# Patient Record
Sex: Female | Born: 1965 | Race: Black or African American | Hispanic: No | Marital: Married | Smoking: Former smoker
Health system: Southern US, Community
[De-identification: ages and names within clinical notes are randomized; demographics above are authoritative.]

## PROBLEM LIST (undated history)

## (undated) DIAGNOSIS — I10 Essential (primary) hypertension: Secondary | ICD-10-CM

## (undated) HISTORY — PX: NO PAST SURGERIES: SHX2092

---

## 2000-01-04 ENCOUNTER — Emergency Department (HOSPITAL_COMMUNITY): Admission: EM | Admit: 2000-01-04 | Discharge: 2000-01-04 | Payer: Self-pay | Admitting: *Deleted

## 2006-10-17 ENCOUNTER — Emergency Department (HOSPITAL_COMMUNITY): Admission: EM | Admit: 2006-10-17 | Discharge: 2006-10-17 | Payer: Self-pay | Admitting: *Deleted

## 2007-05-30 ENCOUNTER — Emergency Department (HOSPITAL_COMMUNITY): Admission: EM | Admit: 2007-05-30 | Discharge: 2007-05-30 | Payer: Self-pay | Admitting: Emergency Medicine

## 2007-11-19 ENCOUNTER — Emergency Department (HOSPITAL_COMMUNITY): Admission: EM | Admit: 2007-11-19 | Discharge: 2007-11-19 | Payer: Self-pay | Admitting: Emergency Medicine

## 2008-09-14 ENCOUNTER — Emergency Department (HOSPITAL_COMMUNITY): Admission: EM | Admit: 2008-09-14 | Discharge: 2008-09-14 | Payer: Self-pay | Admitting: Emergency Medicine

## 2010-03-02 ENCOUNTER — Emergency Department (HOSPITAL_COMMUNITY)
Admission: EM | Admit: 2010-03-02 | Discharge: 2010-03-02 | Payer: Self-pay | Source: Home / Self Care | Admitting: Emergency Medicine

## 2010-06-01 LAB — POCT PREGNANCY, URINE: Preg Test, Ur: NEGATIVE

## 2010-06-01 LAB — POCT I-STAT, CHEM 8
BUN: 10 mg/dL (ref 6–23)
Calcium, Ion: 1.16 mmol/L (ref 1.12–1.32)
Chloride: 108 mEq/L (ref 96–112)
Glucose, Bld: 106 mg/dL — ABNORMAL HIGH (ref 70–99)
Potassium: 4.1 mEq/L (ref 3.5–5.1)

## 2010-06-01 LAB — URINALYSIS, ROUTINE W REFLEX MICROSCOPIC
Glucose, UA: NEGATIVE mg/dL
Hgb urine dipstick: NEGATIVE
Ketones, ur: NEGATIVE mg/dL
Protein, ur: NEGATIVE mg/dL
pH: 6 (ref 5.0–8.0)

## 2010-06-28 LAB — URINE CULTURE: Culture: NO GROWTH

## 2010-06-28 LAB — DIFFERENTIAL
Eosinophils Relative: 0 % (ref 0–5)
Lymphs Abs: 2.4 10*3/uL (ref 0.7–4.0)
Monocytes Absolute: 0.7 10*3/uL (ref 0.1–1.0)
Monocytes Relative: 9 % (ref 3–12)
Neutro Abs: 4.6 10*3/uL (ref 1.7–7.7)

## 2010-06-28 LAB — URINE MICROSCOPIC-ADD ON

## 2010-06-28 LAB — COMPREHENSIVE METABOLIC PANEL
ALT: 16 U/L (ref 0–35)
Alkaline Phosphatase: 65 U/L (ref 39–117)
BUN: 9 mg/dL (ref 6–23)
Chloride: 103 mEq/L (ref 96–112)
Glucose, Bld: 91 mg/dL (ref 70–99)
Potassium: 3.6 mEq/L (ref 3.5–5.1)
Sodium: 138 mEq/L (ref 135–145)
Total Bilirubin: 1.2 mg/dL (ref 0.3–1.2)
Total Protein: 8.7 g/dL — ABNORMAL HIGH (ref 6.0–8.3)

## 2010-06-28 LAB — CBC
HCT: 41.2 % (ref 36.0–46.0)
Hemoglobin: 14.4 g/dL (ref 12.0–15.0)
MCV: 96.2 fL (ref 78.0–100.0)
Platelets: 275 10*3/uL (ref 150–400)
WBC: 7.8 10*3/uL (ref 4.0–10.5)

## 2010-06-28 LAB — POCT CARDIAC MARKERS: Troponin i, poc: <0.05 ng/mL (ref 0.00–0.09)

## 2010-06-28 LAB — URINALYSIS, ROUTINE W REFLEX MICROSCOPIC
Glucose, UA: NEGATIVE mg/dL
Hgb urine dipstick: NEGATIVE
Ketones, ur: >80 mg/dL — AB
Specific Gravity, Urine: 1.04 — ABNORMAL HIGH (ref 1.005–1.030)
pH: 5.5 (ref 5.0–8.0)

## 2010-06-28 LAB — LIPASE, BLOOD: Lipase: 23 U/L (ref 11–59)

## 2010-06-28 LAB — POCT PREGNANCY, URINE: Preg Test, Ur: NEGATIVE

## 2011-06-22 ENCOUNTER — Encounter (HOSPITAL_COMMUNITY): Payer: Self-pay | Admitting: Emergency Medicine

## 2011-06-22 ENCOUNTER — Emergency Department (INDEPENDENT_AMBULATORY_CARE_PROVIDER_SITE_OTHER)
Admission: EM | Admit: 2011-06-22 | Discharge: 2011-06-22 | Disposition: A | Discharge status: Home / Self Care | Payer: Self-pay | Source: Home / Self Care | Attending: Family Medicine | Admitting: Family Medicine

## 2011-06-22 DIAGNOSIS — L0201 Cutaneous abscess of face: Secondary | ICD-10-CM

## 2011-06-22 HISTORY — DX: Essential (primary) hypertension: I10

## 2011-06-22 MED ORDER — AMOXICILLIN 500 MG PO CAPS: 500.0000 mg | ORAL_CAPSULE | Freq: Three times a day (TID) | ORAL | Status: AC

## 2011-06-22 MED ORDER — HYDROCODONE-ACETAMINOPHEN 5-325 MG PO TABS: ORAL_TABLET | ORAL | Status: AC

## 2011-06-22 NOTE — ED Provider Notes (Signed)
History     CSN: [id number]  Arrival date & time 06/22/11  0801   First MD Initiated Contact with Patient 06/22/11 [phone]      Chief Complaint  Patient presents with  . Facial Pain    (Consider location/radiation/quality/duration/timing/severity/associated sxs/prior treatment) HPI Comments: Tina Hensley presents for evaluation of swelling over her right cheek and face. She denies any injury to her face. She denies any dental pain. She does report a broken premolar. He states that this is been there for some time. She denies any fluctuance, drainage, or foul taste. She does report that her eye was stuck shut. This morning. But she denies any visual disturbance. She denies any dysphagia or odynophagia. She reports, that she is taking to penicillin tablets at her daughter had.  Patient is a 46 y.o. female presenting with tooth pain. The history is provided by the patient.  Dental PainPrimary symptoms do not include dental injury, oral bleeding or sore throat. The symptoms began 2 days ago. The symptoms are worsening. The symptoms are new.  Additional symptoms include: gum swelling, gum tenderness and facial swelling. Additional symptoms do not include: dental sensitivity to temperature, purulent gums, trouble swallowing, pain with swallowing and taste disturbance.    Past Medical History  Diagnosis Date  . Hypertension     History reviewed. No pertinent past surgical history.  No family history on file.  History  Substance Use Topics  . Smoking status: Current Everyday Smoker -- 0.5 packs/day    Types: Cigarettes  . Smokeless tobacco: Not on file  . Alcohol Use: 0.6 oz/week    1 Cans of beer per week    OB History    Grav Para Term Preterm Abortions TAB SAB Ect Mult Living                  Review of Systems  Constitutional: Negative.   HENT: Positive for facial swelling and dental problem. Negative for sore throat and trouble swallowing.   Eyes: Negative.   Respiratory: Negative.    Cardiovascular: Negative.   Gastrointestinal: Negative.   Genitourinary: Negative.   Musculoskeletal: Negative.   Skin: Negative.   Neurological: Negative.     Allergies  Review of patient's allergies indicates no known allergies.  Home Medications   Current Outpatient Rx  Name Route Sig Dispense Refill  . AMOXICILLIN 500 MG PO CAPS Oral Take 1 capsule (500 mg total) by mouth 3 (three) times daily. 30 capsule 0  . HYDROCODONE-ACETAMINOPHEN 5-325 MG PO TABS  Take one to two tablets every 4 to 6 hours as needed for pain 20 tablet 0    BP 172/115  Pulse 80  Temp(Src) 98.6 F (37 C) (Oral)  Resp 17  SpO2 100%  LMP 06/15/2011  Physical Exam  Nursing note and vitals reviewed. Constitutional: She is oriented to person, place, and time. She appears well-developed and well-nourished.  HENT:  Head: Normocephalic.    Mouth/Throat: Uvula is midline, oropharynx is clear and moist and mucous membranes are normal. Abnormal dentition. Dental abscesses present.    Eyes: EOM are normal.  Neck: Normal range of motion.  Pulmonary/Chest: Effort normal.  Musculoskeletal: Normal range of motion.  Neurological: She is alert and oriented to person, place, and time.  Skin: Skin is warm and dry.  Psychiatric: Her behavior is normal.    ED Course  Procedures (including critical care time)  Labs Reviewed - No data to display No results found.   1. Facial abscess  MDM  rx given for amoxicillin and hydrocodone; return in 48 hours for evaluation        Renaee Munda, MD 06/22/11 [phone]

## 2011-06-22 NOTE — ED Notes (Signed)
Pt. Stated, My face on the rt. Side is swelling and painful with a broke tooth , I'm not really sure.

## 2011-06-22 NOTE — Discharge Instructions (Signed)
Take antibiotics as directed. Use pain medication as needed. Return here in 48 hours for re-evaluation. Return to care sooner should your symptoms worsen in any way such as swelling of the eye, inability to eat or drink, or any other symptoms that concern you.

## 2011-06-22 NOTE — ED Notes (Signed)
Pt has HTN, but has not taken Medication in years

## 2015-04-13 ENCOUNTER — Encounter (HOSPITAL_COMMUNITY): Payer: Self-pay | Admitting: *Deleted

## 2015-04-13 ENCOUNTER — Emergency Department (HOSPITAL_COMMUNITY)
Admission: EM | Admit: 2015-04-13 | Discharge: 2015-04-14 | Disposition: A | Discharge status: Home / Self Care | Payer: Self-pay | Attending: Emergency Medicine | Admitting: Emergency Medicine

## 2015-04-13 DIAGNOSIS — I10 Essential (primary) hypertension: Secondary | ICD-10-CM | POA: Insufficient documentation

## 2015-04-13 DIAGNOSIS — R42 Dizziness and giddiness: Secondary | ICD-10-CM | POA: Insufficient documentation

## 2015-04-13 DIAGNOSIS — K529 Noninfective gastroenteritis and colitis, unspecified: Secondary | ICD-10-CM | POA: Insufficient documentation

## 2015-04-13 DIAGNOSIS — F419 Anxiety disorder, unspecified: Secondary | ICD-10-CM | POA: Insufficient documentation

## 2015-04-13 DIAGNOSIS — J3489 Other specified disorders of nose and nasal sinuses: Secondary | ICD-10-CM | POA: Insufficient documentation

## 2015-04-13 DIAGNOSIS — R51 Headache: Secondary | ICD-10-CM | POA: Insufficient documentation

## 2015-04-13 DIAGNOSIS — R000 Tachycardia, unspecified: Secondary | ICD-10-CM | POA: Insufficient documentation

## 2015-04-13 DIAGNOSIS — H6122 Impacted cerumen, left ear: Secondary | ICD-10-CM | POA: Insufficient documentation

## 2015-04-13 DIAGNOSIS — F17210 Nicotine dependence, cigarettes, uncomplicated: Secondary | ICD-10-CM | POA: Insufficient documentation

## 2015-04-13 DIAGNOSIS — R05 Cough: Secondary | ICD-10-CM | POA: Insufficient documentation

## 2015-04-13 LAB — COMPREHENSIVE METABOLIC PANEL
ALT: 16 U/L (ref 14–54)
AST: 19 U/L (ref 15–41)
Albumin: 3.6 g/dL (ref 3.5–5.0)
Alkaline Phosphatase: 61 U/L (ref 38–126)
Anion gap: 16 — ABNORMAL HIGH (ref 5–15)
BUN: 6 mg/dL (ref 6–20)
CO2: 21 mmol/L — ABNORMAL LOW (ref 22–32)
Calcium: 8.5 mg/dL — ABNORMAL LOW (ref 8.9–10.3)
Chloride: 110 mmol/L (ref 101–111)
Creatinine, Ser: 0.71 mg/dL (ref 0.44–1.00)
GFR calc Af Amer: >60 mL/min (ref >60)
GFR calc non Af Amer: >60 mL/min (ref >60)
Glucose, Bld: 89 mg/dL (ref 65–99)
Potassium: 4 mmol/L (ref 3.5–5.1)
Sodium: 147 mmol/L — ABNORMAL HIGH (ref 135–145)
Total Bilirubin: 0.3 mg/dL (ref 0.3–1.2)
Total Protein: 7.4 g/dL (ref 6.5–8.1)

## 2015-04-13 LAB — CBC WITH DIFFERENTIAL/PLATELET
Basophils Absolute: 0.1 10*3/uL (ref 0.0–0.1)
Basophils Relative: 1 %
Eosinophils Absolute: 0 10*3/uL (ref 0.0–0.7)
Eosinophils Relative: 1 %
HCT: 36.2 % (ref 36.0–46.0)
Hemoglobin: 12.2 g/dL (ref 12.0–15.0)
Lymphocytes Relative: 28 %
Lymphs Abs: 2.5 10*3/uL (ref 0.7–4.0)
MCH: 31.1 pg (ref 26.0–34.0)
MCHC: 33.7 g/dL (ref 30.0–36.0)
MCV: 92.3 fL (ref 78.0–100.0)
Monocytes Absolute: 0.6 10*3/uL (ref 0.1–1.0)
Monocytes Relative: 7 %
Neutro Abs: 5.7 10*3/uL (ref 1.7–7.7)
Neutrophils Relative %: 63 %
Platelets: 516 10*3/uL — ABNORMAL HIGH (ref 150–400)
RBC: 3.92 MIL/uL (ref 3.87–5.11)
RDW: 13.8 % (ref 11.5–15.5)
WBC: 8.9 10*3/uL (ref 4.0–10.5)

## 2015-04-13 LAB — LIPASE, BLOOD: Lipase: 23 U/L (ref 11–51)

## 2015-04-13 MED ORDER — SODIUM CHLORIDE 0.9 % IV BOLUS (SEPSIS)
1000.0000 mL | Freq: Once | INTRAVENOUS | Status: AC
  Administered 2015-04-13: 1000 mL via INTRAVENOUS

## 2015-04-13 MED ORDER — PROMETHAZINE HCL 25 MG/ML IJ SOLN
25.0000 mg | Freq: Once | INTRAMUSCULAR | Status: AC
  Administered 2015-04-13: 25 mg via INTRAVENOUS
  Filled 2015-04-13: qty 1

## 2015-04-13 MED ORDER — ONDANSETRON HCL 4 MG/2ML IJ SOLN
4.0000 mg | Freq: Once | INTRAMUSCULAR | Status: AC
  Administered 2015-04-13: 4 mg via INTRAVENOUS
  Filled 2015-04-13: qty 2

## 2015-04-13 MED ORDER — KETOROLAC TROMETHAMINE 30 MG/ML IJ SOLN
30.0000 mg | Freq: Once | INTRAMUSCULAR | Status: AC
  Administered 2015-04-13: 30 mg via INTRAVENOUS
  Filled 2015-04-13: qty 1

## 2015-04-13 NOTE — ED Notes (Signed)
Pt with hx of intermittent severe headaches for one month.  Pt reports that she has vomiting with the headaches.  Pt was taking care of family member that had GI bug and now has HA with n/v and diarrhea.

## 2015-04-13 NOTE — ED Notes (Signed)
Pt was given  zofran IV en route from [facility].  Pt has IV in L hand

## 2015-04-13 NOTE — ED Provider Notes (Signed)
CSN: [id number]     Arrival date & time 04/13/15  2025 History   First MD Initiated Contact with Patient 04/13/15 2152     Chief Complaint  Patient presents with  . Headache     (Consider location/radiation/quality/duration/timing/severity/associated sxs/prior Treatment) HPI Tina Hensley is a 50 year old female with a past medical history of HTN who presents to the ED today complaining of emesis and she feels too hot. Patient says her family had the "flu" recently, and she had it too, then it went away. However for the last few weeks she feels ill again. Her husband found her on the floor when he came home from work today because she was so hot and she "needed something cool." She has been experiencing rhinorrhea, ear pressure, sore throat, nonproductive cough, diarrhea and vomiting. Patient explains that anytime she eats she vomits. Denies hematemesis and coffee ground appearance.  Of note patient has been smoking a joint a day, drinking 2 shots of gin and taking Nyquil to go to sleep each night. She also requires a heater to sleep. Past Medical History  Diagnosis Date  . Hypertension    History reviewed. No pertinent past surgical history. No family history on file. Social History  Substance Use Topics  . Smoking status: Current Every Day Smoker -- 0.50 packs/day    Types: Cigarettes  . Smokeless tobacco: None  . Alcohol Use: 0.6 oz/week    1 Cans of beer per week   OB History    No data available     Review of Systems  Constitutional: Positive for chills, diaphoresis, appetite change and fatigue. Negative for fever.  HENT: Positive for congestion, ear pain and rhinorrhea. Negative for ear discharge, facial swelling and sinus pressure.   Eyes: Positive for discharge. Negative for pain, redness and itching.  Respiratory: Positive for cough. Negative for chest tightness, shortness of breath and wheezing.   Cardiovascular: Negative for chest pain, palpitations and leg swelling.   Gastrointestinal: Positive for nausea, vomiting and diarrhea. Negative for abdominal pain, constipation, blood in stool and abdominal distention.  Genitourinary: Negative for dysuria.  Musculoskeletal: Positive for gait problem.  Neurological: Positive for dizziness, weakness and headaches. Negative for syncope and light-headedness.  Hematological: Negative for adenopathy.  Psychiatric/Behavioral: The patient is nervous/anxious.       Allergies  Review of patient's allergies indicates no known allergies.  Home Medications   Prior to Admission medications   Medication Sig Start Date End Date Taking? Authorizing Provider  Pseudoeph-Doxylamine-DM-APAP (NYQUIL MULTI-SYMPTOM PO) Take 30 mLs by mouth at bedtime as needed (for cold).   Yes Historical Provider, MD   BP 166/116 mmHg  Pulse 94  Temp(Src) 98.5 F (36.9 C) (Oral)  Resp 18  Ht  (1.626 m)  Wt 67.132 kg  BMI 25.39 kg/m2  SpO2 100% Physical Exam  Constitutional: She is oriented to person, place, and time. She appears well-developed and well-nourished. She appears distressed.  Patient moving around in the bed with paper towel on her face appearing very anxious.  HENT:  Head: Normocephalic and atraumatic.  Right Ear: Tympanic membrane and external ear normal.  Left Ear: External ear normal.  Nose: Mucosal edema and rhinorrhea present. Right sinus exhibits no maxillary sinus tenderness and no frontal sinus tenderness. Left sinus exhibits no maxillary sinus tenderness and no frontal sinus tenderness.  Mouth/Throat: Uvula is midline. Posterior oropharyngeal erythema present.  Cerumen blocking visualization of L TM. Possible sores of posterior oropharynx bilaterally  Cardiovascular: Regular rhythm,  normal heart sounds and intact distal pulses.  Tachycardia present.   Pulmonary/Chest: Breath sounds normal.  Patient initially panting and appearing to be in respiratory distressed but calmed by the end of the interview   Abdominal: Soft. Bowel sounds are normal. She exhibits no distension. There is no tenderness. There is no rebound.  Neurological: She is alert and oriented to person, place, and time. No cranial nerve deficit. She exhibits normal muscle tone. Coordination normal.  Skin: Skin is warm. No erythema.    ED Course  Procedures (including critical care time) Labs Review Labs Reviewed  COMPREHENSIVE METABOLIC PANEL - Abnormal; Notable for the following:    Sodium 147 (*)    CO2 21 (*)    Calcium 8.5 (*)    Anion gap 16 (*)    All other components within normal limits  CBC WITH DIFFERENTIAL/PLATELET - Abnormal; Notable for the following:    Platelets 516 (*)    All other components within normal limits  LIPASE, BLOOD  URINALYSIS, ROUTINE W REFLEX MICROSCOPIC (NOT AT [facility])    Imaging Review No results found. I have personally reviewed and evaluated these images and lab results as part of my medical decision-making.    Patient most likely has gastroenteritis.  I did [facility] 11:15 for management of her blood pressure.  The patient has had a recent upper respiratory illness, along with nausea, vomiting, diarrhea.  She also had family members with similar symptoms.  Patient's best return here as needed.  Told to increase her fluid intake.  At this time.  She is feeling vastly improved.  Charlestine Night, PA-C 04/14/15 0981  Vanetta Mulders, MD 04/14/15 [phone]

## 2015-04-14 ENCOUNTER — Encounter: Payer: Self-pay | Admitting: Family Medicine

## 2015-04-14 ENCOUNTER — Ambulatory Visit: Payer: Self-pay | Attending: Family Medicine | Admitting: Family Medicine

## 2015-04-14 VITALS — BP 168/107 | HR 71

## 2015-04-14 DIAGNOSIS — Z131 Encounter for screening for diabetes mellitus: Secondary | ICD-10-CM

## 2015-04-14 DIAGNOSIS — F1910 Other psychoactive substance abuse, uncomplicated: Secondary | ICD-10-CM | POA: Insufficient documentation

## 2015-04-14 DIAGNOSIS — K529 Noninfective gastroenteritis and colitis, unspecified: Secondary | ICD-10-CM | POA: Insufficient documentation

## 2015-04-14 DIAGNOSIS — I10 Essential (primary) hypertension: Secondary | ICD-10-CM

## 2015-04-14 LAB — POCT GLYCOSYLATED HEMOGLOBIN (HGB A1C): HEMOGLOBIN A1C: 5.8

## 2015-04-14 MED ORDER — CLONIDINE HCL 0.1 MG PO TABS
0.1000 mg | ORAL_TABLET | Freq: Once | ORAL | Status: AC
  Administered 2015-04-14: 0.1 mg via ORAL

## 2015-04-14 MED ORDER — AMLODIPINE BESYLATE 10 MG PO TABS: 10.0000 mg | ORAL_TABLET | Freq: Every day | ORAL | Status: DC

## 2015-04-14 MED ORDER — PROMETHAZINE HCL 25 MG PO TABS: 25.0000 mg | ORAL_TABLET | Freq: Three times a day (TID) | ORAL | Status: DC | PRN

## 2015-04-14 MED FILL — AMLODIPINE BESYLATE 10 MG T: 10 | 30 days supply | Qty: 30 | Fill #0

## 2015-04-14 NOTE — Patient Instructions (Signed)
Hypertension Hypertension, commonly called high blood pressure, is when the force of blood pumping through your arteries is too strong. Your arteries are the blood vessels that carry blood from your heart throughout your body. A blood pressure reading consists of a higher number over a lower number, such as 110/72. The higher number (systolic) is the pressure inside your arteries when your heart pumps. The lower number (diastolic) is the pressure inside your arteries when your heart relaxes. Ideally you want your blood pressure below 120/80. Hypertension forces your heart to work harder to pump blood. Your arteries may become narrow or stiff. Having untreated or uncontrolled hypertension can cause heart attack, stroke, kidney disease, and other problems. RISK FACTORS Some risk factors for high blood pressure are controllable. Others are not.  Risk factors you cannot control include:   Race. You may be at higher risk if you are African American.  Age. Risk increases with age.  Gender. Men are at higher risk than women before age 45 years. After age 65, women are at higher risk than men. Risk factors you can control include:  Not getting enough exercise or physical activity.  Being overweight.  Getting too much fat, sugar, calories, or salt in your diet.  Drinking too much alcohol. SIGNS AND SYMPTOMS Hypertension does not usually cause signs or symptoms. Extremely high blood pressure (hypertensive crisis) may cause headache, anxiety, shortness of breath, and nosebleed. DIAGNOSIS To check if you have hypertension, your health care provider will measure your blood pressure while you are seated, with your arm held at the level of your heart. It should be measured at least twice using the same arm. Certain conditions can cause a difference in blood pressure between your right and left arms. A blood pressure reading that is higher than normal on one occasion does not mean that you need treatment. If  it is not clear whether you have high blood pressure, you may be asked to return on a different day to have your blood pressure checked again. Or, you may be asked to monitor your blood pressure at home for 1 or more weeks. TREATMENT Treating high blood pressure includes making lifestyle changes and possibly taking medicine. Living a healthy lifestyle can help lower high blood pressure. You may need to change some of your habits. Lifestyle changes may include:  Following the DASH diet. This diet is high in fruits, vegetables, and whole grains. It is low in salt, red meat, and added sugars.  Keep your sodium intake below 2,300 mg per day.  Getting at least 30-45 minutes of aerobic exercise at least 4 times per week.  Losing weight if necessary.  Not smoking.  Limiting alcoholic beverages.  Learning ways to reduce stress. Your health care provider may prescribe medicine if lifestyle changes are not enough to get your blood pressure under control, and if one of the following is true:  You are 18-59 years of age and your systolic blood pressure is above 140.  You are 60 years of age or older, and your systolic blood pressure is above 150.  Your diastolic blood pressure is above 90.  You have diabetes, and your systolic blood pressure is over 140 or your diastolic blood pressure is over 90.  You have kidney disease and your blood pressure is above 140/90.  You have heart disease and your blood pressure is above 140/90. Your personal target blood pressure may vary depending on your medical conditions, your age, and other factors. HOME CARE INSTRUCTIONS    Have your blood pressure rechecked as directed by your health care provider.   Take medicines only as directed by your health care provider. Follow the directions carefully. Blood pressure medicines must be taken as prescribed. The medicine does not work as well when you skip doses. Skipping doses also puts you at risk for  problems.  Do not smoke.   Monitor your blood pressure at home as directed by your health care provider. SEEK MEDICAL CARE IF:   You think you are having a reaction to medicines taken.  You have recurrent headaches or feel dizzy.  You have swelling in your ankles.  You have trouble with your vision. SEEK IMMEDIATE MEDICAL CARE IF:  You develop a severe headache or confusion.  You have unusual weakness, numbness, or feel faint.  You have severe chest or abdominal pain.  You vomit repeatedly.  You have trouble breathing. MAKE SURE YOU:   Understand these instructions.  Will watch your condition.  Will get help right away if you are not doing well or get worse.   This information is not intended to replace advice given to you by your health care provider. Make sure you discuss any questions you have with your health care provider.   Document Released: 03/07/2005 Document Revised: 07/22/2014 Document Reviewed: 12/28/2012 Elsevier Interactive Patient Education 2016 [employer]  

## 2015-04-14 NOTE — Discharge Instructions (Signed)
Return here as needed.  Your appointment with your primary care doctor is scheduled for tomorrow at 11:15.  Would suggest arriving 15 minutes early

## 2015-04-14 NOTE — Progress Notes (Signed)
Patient here to establish care She reports she does not smoke cigarettes but does use marijuana and states "I used to drink a couple of shot a day." She takes no medications

## 2015-04-14 NOTE — Progress Notes (Signed)
   Subjective:  Patient ID: Tina Hensley, female    DOB: Jul 16, 1965  Age: 50 y.o. MRN: [id number]  CC: Establish Care   HPI Tina Hensley is a 50 year old female with a history of hypertension, substance abuse, seen at the ED yesterday for gastroenteritis and prescribed promethazine which she is yet to pick up. At her visit her blood pressure was elevated but she was not discharged with any antihypertensive; her blood pressure is severely elevated today and she does give a history of hypertension but has not been on any medications.  States that GI symptoms have resolved; she denies chest pain, shortness of breath.  Outpatient Prescriptions Prior to Visit  Medication Sig Dispense Refill  . promethazine (PHENERGAN) 25 MG tablet Take 1 tablet (25 mg total) by mouth every 8 (eight) hours as needed for nausea or vomiting. (Patient not taking: Reported on 04/14/2015) 15 tablet 0  . Pseudoeph-Doxylamine-DM-APAP (NYQUIL MULTI-SYMPTOM PO) Take 30 mLs by mouth at bedtime as needed (for cold). Reported on 04/14/2015     No facility-administered medications prior to visit.    ROS Review of Systems  Constitutional: Negative for activity change and appetite change.  HENT: Negative for sinus pressure and sore throat.   Respiratory: Negative for chest tightness, shortness of breath and wheezing.   Cardiovascular: Negative for chest pain and palpitations.  Gastrointestinal: Negative for abdominal pain, constipation and abdominal distention.  Genitourinary: Negative.   Musculoskeletal: Negative.   Psychiatric/Behavioral: Negative for behavioral problems and dysphoric mood.    Objective:  BP 180/115 mmHg  BP/Weight 04/14/2015 04/14/2015 04/13/2015  Systolic BP 180 154 -  Diastolic BP 115 95 -  Wt. (Lbs) - - 148  BMI - - 25.39      Physical Exam  Constitutional: She is oriented to person, place, and time. She appears well-developed and well-nourished.  Cardiovascular: Normal rate, normal heart sounds  and intact distal pulses.   No murmur heard. Pulmonary/Chest: Effort normal and breath sounds normal. She has no wheezes. She has no rales. She exhibits no tenderness.  Abdominal: Soft. Bowel sounds are normal. She exhibits no distension and no mass. There is no tenderness.  Musculoskeletal: Normal range of motion.  Neurological: She is alert and oriented to person, place, and time.     Assessment & Plan:   1. Screening for diabetes mellitus A1c- 5.8- normal - HgB A1c  2. Accelerated hypertension Commenced on Norvasc. We'll reassess blood pressure at her next office visit. Advised on low sodium, DASH diet. - cloNIDine (CATAPRES) tablet 0.1 mg; Take 1 tablet (0.1 mg total) by mouth once. - amLODipine (NORVASC) 10 MG tablet; Take 1 tablet (10 mg total) by mouth daily.  Dispense: 30 tablet; Refill: 3   Meds ordered this encounter  Medications  . cloNIDine (CATAPRES) tablet 0.1 mg    Sig:   . amLODipine (NORVASC) 10 MG tablet    Sig: Take 1 tablet (10 mg total) by mouth daily.    Dispense:  30 tablet    Refill:  3    Follow-up: 1 month for follow up on hypertension.  Jaclyn Shaggy MD

## 2015-04-29 ENCOUNTER — Ambulatory Visit: Payer: Self-pay

## 2015-08-19 ENCOUNTER — Encounter (HOSPITAL_COMMUNITY): Payer: Self-pay

## 2015-08-19 ENCOUNTER — Emergency Department (HOSPITAL_COMMUNITY)
Admission: EM | Admit: 2015-08-19 | Discharge: 2015-08-19 | Disposition: A | Discharge status: Home / Self Care | Payer: Self-pay | Attending: Emergency Medicine | Admitting: Emergency Medicine

## 2015-08-19 DIAGNOSIS — Z79899 Other long term (current) drug therapy: Secondary | ICD-10-CM | POA: Insufficient documentation

## 2015-08-19 DIAGNOSIS — R220 Localized swelling, mass and lump, head: Secondary | ICD-10-CM

## 2015-08-19 DIAGNOSIS — I10 Essential (primary) hypertension: Secondary | ICD-10-CM

## 2015-08-19 DIAGNOSIS — Z87891 Personal history of nicotine dependence: Secondary | ICD-10-CM | POA: Insufficient documentation

## 2015-08-19 MED ORDER — PREDNISONE 20 MG PO TABS
60.0000 mg | ORAL_TABLET | Freq: Once | ORAL | Status: AC
  Administered 2015-08-19: 60 mg via ORAL
  Filled 2015-08-19: qty 3

## 2015-08-19 MED ORDER — PREDNISONE 20 MG PO TABS: 60.0000 mg | ORAL_TABLET | Freq: Every day | ORAL | Status: DC

## 2015-08-19 MED ORDER — AMLODIPINE BESYLATE 10 MG PO TABS: 10.0000 mg | ORAL_TABLET | Freq: Every day | ORAL | Status: DC

## 2015-08-19 MED ORDER — DIPHENHYDRAMINE HCL 25 MG PO CAPS
25.0000 mg | ORAL_CAPSULE | Freq: Once | ORAL | Status: AC
  Administered 2015-08-19: 25 mg via ORAL
  Filled 2015-08-19: qty 1

## 2015-08-19 NOTE — ED Provider Notes (Signed)
CSN: [id number]     Arrival date & time 08/19/15  7829 History   First MD Initiated Contact with Patient 08/19/15 [phone]     Chief Complaint  Patient presents with  . Facial Swelling     (Consider location/radiation/quality/duration/timing/severity/associated sxs/prior Treatment) HPI Comments: Patient is a 50 year old female with history of hypertension who presents with facial swelling. The swelling is located on the right temporal and frontal area and began around 7 AM this morning. The patient states she was itching and felt irritated behind her right ear throughout the night. Patient described a "wetness" to the area this morning. Patient is unsure whether it was blood or another fluid. The patient then washed the area and her hair well and applied conditioner. Patient states she dyed her hair yesterday morning with a different color than usual. Patient states she normally has some irritation and swelling to her neck following dying her hair, but it has never been this bad. Patient denies any pain, itching, swelling of her lips, tongue, throat, shortness of breath, chest pain, abdominal pain, nausea, dysuria, headaches, visual changes. Patient does report an episode of vomiting/gagging on blue Gatorade this, which she states is normal when she drinks this can of Gatorade.  The history is provided by the patient.    Past Medical History  Diagnosis Date  . Hypertension    History reviewed. No pertinent past surgical history. No family history on file. Social History  Substance Use Topics  . Smoking status: Former Smoker -- 0.00 packs/day  . Smokeless tobacco: None  . Alcohol Use: 0.6 oz/week    1 Cans of beer per week   OB History    No data available     Review of Systems  Constitutional: Negative for fever and chills.  HENT: Positive for facial swelling. Negative for sore throat.   Eyes: Negative for visual disturbance.  Respiratory: Negative for shortness of breath.     Cardiovascular: Negative for chest pain.  Gastrointestinal: Negative for nausea, vomiting and abdominal pain.  Genitourinary: Negative for dysuria.  Musculoskeletal: Negative for back pain.  Skin: Positive for rash. Negative for wound.  Neurological: Negative for headaches.  Psychiatric/Behavioral: The patient is not nervous/anxious.       Allergies  Review of patient's allergies indicates no known allergies.  Home Medications   Prior to Admission medications   Medication Sig Start Date End Date Taking? Authorizing Provider  amLODipine (NORVASC) 10 MG tablet Take 1 tablet (10 mg total) by mouth daily. 08/19/15   Emi Holes, PA-C  predniSONE (DELTASONE) 20 MG tablet Take 3 tablets (60 mg total) by mouth daily. 08/19/15   Emi Holes, PA-C  promethazine (PHENERGAN) 25 MG tablet Take 1 tablet (25 mg total) by mouth every 8 (eight) hours as needed for nausea or vomiting. Patient not taking: Reported on 04/14/2015 04/14/15   Charlestine Night, PA-C  Pseudoeph-Doxylamine-DM-APAP (NYQUIL MULTI-SYMPTOM PO) Take 30 mLs by mouth at bedtime as needed (for cold). Reported on 04/14/2015    Historical Provider, MD   BP 168/105 mmHg  Pulse 87  Temp(Src) 98.3 F (36.8 C) (Oral)  SpO2 100%  LMP 08/04/2015 Physical Exam  Constitutional: She appears well-developed and well-nourished. No distress.  HENT:  Head: Normocephalic and atraumatic.    Mouth/Throat: Oropharynx is clear and moist. No oropharyngeal exudate.  Moderate edema to circled area on image- no TTP or erythema; 3 papules posterior right ear, no surrounding edema, erythema, or drainage; multiple papules to the nape of neck  with mild underlying edema  Eyes: Conjunctivae and EOM are normal. Pupils are equal, round, and reactive to light. Right eye exhibits no discharge. Left eye exhibits no discharge. No scleral icterus.  No pain or difficulty with EOMs  Neck: Normal range of motion. Neck supple. No thyromegaly present.   Cardiovascular: Normal rate, regular rhythm, normal heart sounds and intact distal pulses.  Exam reveals no gallop and no friction rub.   No murmur heard. Pulmonary/Chest: Effort normal and breath sounds normal. No stridor. No respiratory distress. She has no wheezes. She has no rales.  Abdominal: Soft. Bowel sounds are normal. She exhibits no distension. There is no tenderness. There is no rebound and no guarding.  Musculoskeletal: She exhibits no edema.  Lymphadenopathy:    She has no cervical adenopathy.  Neurological: She is alert. Coordination normal.  Normal sensation to light touch to face  Skin: Skin is warm and dry. No rash noted. She is not diaphoretic. No pallor.  Psychiatric: She has a normal mood and affect.  Nursing note and vitals reviewed.   ED Course  Procedures (including critical care time) Labs Review Labs Reviewed - No data to display  Imaging Review No results found. I have personally reviewed and evaluated these images and lab results as part of my medical decision-making.   EKG Interpretation None      MDM   I suspect allergic reaction due to hair dye. Patient given Benadryl and prednisone in ED. Patient feels like her facial swelling has improved since onset. Patient is hemodynamically stable, in no respiratory distress, and denies the feeling of throat closing, normal phonation. No wheezing, no vomiting, no syncope. Patient denying any pain, difficulty breathing or swallowing, swelling of her lips, tongue. Will discharge patient with Benadryl, prednisone 5 days. Patient advised to discontinue use of new hair dye. Patient to follow up with PCP if symptoms are not resolving, as well as to recheck blood pressure. I also refilled patient's Norvasc prescription that she has not been able to see PCP due to low funds. Discussed signs and symptoms of anaphylaxis and severe allergic reaction. Pt advised to return for any worsening in symptoms or any concerns. Patient  vitals stable throughout ED course and discharged in satisfactory condition. Patient discussed with Dr. Criss AlvineGoldston who is in agreement with plan.   Final diagnoses:  Facial swelling        Emi Holeslexandra M Helio Lack, PA-C 08/19/15 1132  Pricilla LovelessScott Goldston, MD 08/20/15 [phone]

## 2015-08-19 NOTE — ED Notes (Signed)
Pt. Presents with complaint of swelling to R temporal/eye area. Pt. Reports she used some hair dye yesterday that she believes may have irritated area. Pt. Reports no changes to vision or difficulty breathing. AxO x4.

## 2015-08-19 NOTE — Discharge Instructions (Signed)
Medications: Prednisone, Norvasc  Treatment: Take prednisone as prescribed for 5 days. Take Benadryl daily as prescribed over-the-counter. Do not use the hair dye that you used yesterday again. Wash your hair well to remove any excess.   Follow-up: Please follow-up with your primary care provider for further evaluation of your symptoms continue, as well as recheck of your blood pressure. Please return to the emergency department if you develop any new or worsening symptoms, such as fever, swelling of your throat, tongue, lips, difficulty breathing, or any other new or concerning symptoms.

## 2015-11-29 ENCOUNTER — Encounter (HOSPITAL_COMMUNITY): Payer: Self-pay

## 2015-11-29 ENCOUNTER — Emergency Department (HOSPITAL_COMMUNITY)
Admission: EM | Admit: 2015-11-29 | Discharge: 2015-11-29 | Disposition: A | Discharge status: Home / Self Care | Payer: Self-pay | Attending: Emergency Medicine | Admitting: Emergency Medicine

## 2015-11-29 ENCOUNTER — Emergency Department (HOSPITAL_COMMUNITY): Payer: Self-pay

## 2015-11-29 DIAGNOSIS — I10 Essential (primary) hypertension: Secondary | ICD-10-CM | POA: Insufficient documentation

## 2015-11-29 DIAGNOSIS — Z87891 Personal history of nicotine dependence: Secondary | ICD-10-CM | POA: Insufficient documentation

## 2015-11-29 DIAGNOSIS — R079 Chest pain, unspecified: Secondary | ICD-10-CM | POA: Insufficient documentation

## 2015-11-29 LAB — I-STAT TROPONIN, ED
TROPONIN I, POC: 0 ng/mL (ref 0.00–0.08)
TROPONIN I, POC: 0 ng/mL (ref 0.00–0.08)

## 2015-11-29 LAB — BASIC METABOLIC PANEL
Anion gap: 11 (ref 5–15)
BUN: 12 mg/dL (ref 6–20)
CALCIUM: 8.9 mg/dL (ref 8.9–10.3)
CO2: 20 mmol/L — ABNORMAL LOW (ref 22–32)
CREATININE: 0.66 mg/dL (ref 0.44–1.00)
Chloride: 108 mmol/L (ref 101–111)
GFR calc Af Amer: >60 mL/min (ref >60)
Glucose, Bld: 93 mg/dL (ref 65–99)
POTASSIUM: 2.9 mmol/L — AB (ref 3.5–5.1)
SODIUM: 139 mmol/L (ref 135–145)

## 2015-11-29 LAB — CBC
HEMATOCRIT: 33.3 % — AB (ref 36.0–46.0)
Hemoglobin: 11 g/dL — ABNORMAL LOW (ref 12.0–15.0)
MCH: 30.7 pg (ref 26.0–34.0)
MCHC: 33 g/dL (ref 30.0–36.0)
MCV: 93 fL (ref 78.0–100.0)
PLATELETS: 417 10*3/uL — AB (ref 150–400)
RBC: 3.58 MIL/uL — ABNORMAL LOW (ref 3.87–5.11)
RDW: 14.9 % (ref 11.5–15.5)
WBC: 7.4 10*3/uL (ref 4.0–10.5)

## 2015-11-29 MED ORDER — MORPHINE SULFATE (PF) 4 MG/ML IV SOLN
4.0000 mg | Freq: Once | INTRAVENOUS | Status: AC
  Administered 2015-11-29: 4 mg via INTRAVENOUS
  Filled 2015-11-29: qty 1

## 2015-11-29 MED ORDER — POTASSIUM CHLORIDE CRYS ER 20 MEQ PO TBCR
40.0000 meq | EXTENDED_RELEASE_TABLET | Freq: Once | ORAL | Status: AC
  Administered 2015-11-29: 40 meq via ORAL
  Filled 2015-11-29: qty 2

## 2015-11-29 MED ORDER — POTASSIUM CHLORIDE 10 MEQ/100ML IV SOLN
10.0000 meq | Freq: Once | INTRAVENOUS | Status: AC
  Administered 2015-11-29: 10 meq via INTRAVENOUS
  Filled 2015-11-29: qty 100

## 2015-11-29 NOTE — Discharge Instructions (Signed)
Please read and follow all provided instructions.  Your diagnoses today include:  1. Chest pain, unspecified chest pain type     Tests performed today include: An EKG of your heart A chest x-ray Cardiac enzymes - a blood test for heart muscle damage Blood counts and electrolytes Vital signs. See below for your results today.   Medications prescribed:   Take any prescribed medications only as directed.  Follow-up instructions: Please follow-up with your primary care provider as soon as you can for further evaluation of your symptoms.   Return instructions:  SEEK IMMEDIATE MEDICAL ATTENTION IF: You have severe chest pain, especially if the pain is crushing or pressure-like and spreads to the arms, back, neck, or jaw, or if you have sweating, nausea (feeling sick to your stomach), or shortness of breath. THIS IS AN EMERGENCY. Don't wait to see if the pain will go away. Get medical help at once. Call 911 or 0 (operator). DO NOT drive yourself to the hospital.  Your chest pain gets worse and does not go away with rest.  You have an attack of chest pain lasting longer than usual, despite rest and treatment with the medications your caregiver has prescribed.  You wake from sleep with chest pain or shortness of breath. You feel dizzy or faint. You have chest pain not typical of your usual pain for which you originally saw your caregiver.  You have any other emergent concerns regarding your health.  Additional Information: Chest pain comes from many different causes. Your caregiver has diagnosed you as having chest pain that is not specific for one problem, but does not require admission.  You are at low risk for an acute heart condition or other serious illness.   Your vital signs today were: BP 165/90    Pulse 81    Temp 98.6 F (37 C) (Oral)    Resp 20    SpO2 100%  If your blood pressure (BP) was elevated above 135/85 this visit, please have this repeated by your doctor within one  month. --------------

## 2015-11-29 NOTE — ED Triage Notes (Signed)
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  EMS gave 324 ASA and 1 nitro. Last pressure was 150/90.  Pain is 5/10 mid sternal. Patient A&Ox4

## 2015-11-29 NOTE — ED Provider Notes (Signed)
[facility]-EMERGENCY DEPT Provider Note   CSN: [id number] Arrival date & time: 11/29/15  1853     History   Chief Complaint Chief Complaint  Patient presents with  . Chest Pain    HPI Tina Hensley is a 50 y.o. female.  HPI  50 y.o. female with a hx of HTN, presents to the Emergency Department today complaining of chest pain with onset this afternoon around 1730. Pt states the pain occurred while cleaning the house. The pain was sharp and centrally located. Noted pain was 8/10 and come in waves of 10 minutes. Pt notified EMS and was given NTG and ASA with mild improvement of pain. Notes current CP 5/10. No worsening with inspiration. No pain radiating into back. No radiation in arms. No N/V. No diaphoresis. No hx same. Pt without hx ACS. No hx DVT/PE. No recent long distance travel. No recent surgeries. No estrogen use. No fevers. No SOB/ABD pain. No other symptoms noted.    Past Medical History:  Diagnosis Date  . Hypertension     There are no active problems to display for this patient.   History reviewed. No pertinent surgical history.  OB History    No data available       Home Medications    Prior to Admission medications   Medication Sig Start Date End Date Taking? Authorizing Provider  amLODipine (NORVASC) 10 MG tablet Take 1 tablet (10 mg total) by mouth daily. 08/19/15   Emi Holes, PA-C  predniSONE (DELTASONE) 20 MG tablet Take 3 tablets (60 mg total) by mouth daily. 08/19/15   Emi Holes, PA-C  promethazine (PHENERGAN) 25 MG tablet Take 1 tablet (25 mg total) by mouth every 8 (eight) hours as needed for nausea or vomiting. Patient not taking: Reported on 04/14/2015 04/14/15   Charlestine Night, PA-C  Pseudoeph-Doxylamine-DM-APAP (NYQUIL MULTI-SYMPTOM PO) Take 30 mLs by mouth at bedtime as needed (for cold). Reported on 04/14/2015    Historical Provider, MD    Family History History reviewed. No pertinent family history.  Social History Social History    Substance Use Topics  . Smoking status: Former Smoker    Packs/day: 0.00  . Smokeless tobacco: Never Used  . Alcohol use 0.6 oz/week    1 Cans of beer per week     Allergies   Review of patient's allergies indicates no known allergies.   Review of Systems Review of Systems ROS reviewed and all are negative for acute change except as noted in the HPI.  Physical Exam Updated Vital Signs BP 184/88   Pulse 90   Temp 98.6 F (37 C) (Oral)   Resp 16   SpO2 100%   Physical Exam  Constitutional: She is oriented to person, place, and time. Vital signs are normal. She appears well-developed and well-nourished.  HENT:  Head: Normocephalic and atraumatic.  Right Ear: Hearing normal.  Left Ear: Hearing normal.  Eyes: Conjunctivae and EOM are normal. Pupils are equal, round, and reactive to light.  Neck: Normal range of motion. Neck supple.  Cardiovascular: Normal rate, regular rhythm, normal heart sounds and intact distal pulses.   Pulmonary/Chest: Effort normal and breath sounds normal. No respiratory distress. She has no wheezes. She has no rales. She exhibits no tenderness.  Abdominal: Soft.  Neurological: She is alert and oriented to person, place, and time.  Skin: Skin is warm and dry.  Psychiatric: She has a normal mood and affect. Her speech is normal and behavior is normal. Thought content normal.  Nursing note and vitals reviewed.  ED Treatments / Results  Labs (all labs ordered are listed, but only abnormal results are displayed) Labs Reviewed  BASIC METABOLIC PANEL - Abnormal; Notable for the following:       Result Value   Potassium 2.9 (*)    CO2 20 (*)    All other components within normal limits  CBC - Abnormal; Notable for the following:    RBC 3.58 (*)    Hemoglobin 11.0 (*)    HCT 33.3 (*)    Platelets 417 (*)    All other components within normal limits  I-STAT TROPOININ, ED   EKG  EKG Interpretation  Date/Time:  Sunday November 29 2015 19:05:48  EDT Ventricular Rate:  88 PR Interval:    QRS Duration: 67 QT Interval:  363 QTC Calculation: 440 R Axis:   44 Text Interpretation:  Sinus rhythm no acute changes  Confirmed by LIU MD, DANA [phone]([id number]) on 11/29/2015 7:08:00 PM      Radiology Dg Chest 2 View  Result Date: 11/29/2015 CLINICAL DATA:  Chest pain. EXAM: CHEST  2 VIEW COMPARISON:  None. FINDINGS: Normal heart size and mediastinal contours. No acute infiltrate or edema. EKG pad over the right upper chest. No effusion or pneumothorax. Cervical ribs. No acute osseous findings. IMPRESSION: No evidence of active disease. Electronically Signed   By: Marnee SpringJonathon  Watts M.D.   On: 11/29/2015 20:17   Procedures Procedures (including critical care time)  Medications Ordered in ED Medications - No data to display   Initial Impression / Assessment and Plan / ED Course  I have reviewed the triage vital signs and the nursing notes.  Pertinent labs & imaging results that were available during my care of the patient were reviewed by me and considered in my medical decision making (see chart for details).  Clinical Course   Final Clinical Impressions(s) / ED Diagnoses  I have reviewed and evaluated the relevant laboratory values I have reviewed and evaluated the relevant imaging studies.  I have interpreted the relevant EKG. I have reviewed the relevant previous healthcare records. I have reviewed EMS Documentation. I obtained HPI from historian. Patient discussed with supervising physician  ED Course:  Assessment: Pt is a 50yF presents with CP with onset today at 1730. Cleaning the house during episode. Substernal. No radiation. Intermittent bouts with 10 min duration. Risk Factors HTN. Given analgesia in ED. Patient is to be discharged with recommendation to follow up with PCP in regards to today's hospital visit. Chest pain is not likely of cardiac or pulmonary etiology d/t presentation, VSS, no tracheal deviation, no JVD or new murmur, RRR,  breath sounds equal bilaterally, EKG without acute abnormalities, negative troponin, and negative CXR. Heart Score 3. Low suspicion for DVT/PE. No tachycardia. No hypoxia. Non pleuritic. Pt without pain currently. Plan is to DC home if delta troponin unremarkable with follow up to PCP. [facility]      Disposition/Plan:  Dispo Pending Delta Troponin 8:23 PM- Sign out to Antony MaduraKelly Humes, PA-C   Supervising Physician Lavera Guiseana Duo Liu, MD   Final diagnoses:  Chest pain, unspecified chest pain type    New Prescriptions New Prescriptions   No medications on file      Audry Piliyler Renette Hsu, PA-C 11/29/15 2028    Lavera Guiseana Duo Liu, MD 11/30/15 [phone]

## 2015-11-29 NOTE — ED Provider Notes (Signed)
11:28 PM Patient care assumed from Audry Piliyler Mohr, PA-C at shift change. Plan discussed which includes discharge if delta troponin negative. Patient with heart score of 2-3 depending upon level of suspicion, both of which are c/w low risk of acute coronary event.  Delta troponin reviewed which is also 0. Given reassuring work up and nonischemic EKG, low suspicion for cardiac etiology. Patient has been assessed. She is resting comfortably, in no distress. No additional complaints. Patient to be referred to a primary care doctor for follow-up. Return precautions discussed and provided. Patient discharged in satisfactory condition with no unaddressed concerns.    EKG Interpretation  Date/Time:  Sunday November 29 2015 19:05:48 EDT Ventricular Rate:  88 PR Interval:    QRS Duration: 67 QT Interval:  363 QTC Calculation: 440 R Axis:   44 Text Interpretation:  Sinus rhythm no acute changes  Confirmed by LIU MD, DANA [phone]([id number]) on 11/29/2015 7:08:00 PM         Antony MaduraKelly Kemari Narez, PA-C 11/29/15 2332    Lavera Guiseana Duo Liu, MD 11/30/15 0010

## 2015-12-03 ENCOUNTER — Emergency Department (HOSPITAL_COMMUNITY)
Admission: EM | Admit: 2015-12-03 | Discharge: 2015-12-03 | Disposition: A | Discharge status: Home / Self Care | Payer: Self-pay | Attending: Emergency Medicine | Admitting: Emergency Medicine

## 2015-12-03 ENCOUNTER — Encounter (HOSPITAL_COMMUNITY): Payer: Self-pay | Admitting: *Deleted

## 2015-12-03 DIAGNOSIS — I10 Essential (primary) hypertension: Secondary | ICD-10-CM | POA: Insufficient documentation

## 2015-12-03 DIAGNOSIS — R55 Syncope and collapse: Secondary | ICD-10-CM | POA: Insufficient documentation

## 2015-12-03 DIAGNOSIS — Z87891 Personal history of nicotine dependence: Secondary | ICD-10-CM | POA: Insufficient documentation

## 2015-12-03 DIAGNOSIS — R112 Nausea with vomiting, unspecified: Secondary | ICD-10-CM | POA: Insufficient documentation

## 2015-12-03 LAB — CBC WITH DIFFERENTIAL/PLATELET
Basophils Absolute: 0 K/uL (ref 0.0–0.1)
Basophils Relative: 1 %
Eosinophils Absolute: 0 K/uL (ref 0.0–0.7)
Eosinophils Relative: 0 %
HCT: 38.6 % (ref 36.0–46.0)
Hemoglobin: 12.5 g/dL (ref 12.0–15.0)
Lymphocytes Relative: 35 %
Lymphs Abs: 2.3 K/uL (ref 0.7–4.0)
MCH: 30.6 pg (ref 26.0–34.0)
MCHC: 32.4 g/dL (ref 30.0–36.0)
MCV: 94.6 fL (ref 78.0–100.0)
Monocytes Absolute: 0.5 K/uL (ref 0.1–1.0)
Monocytes Relative: 8 %
Neutro Abs: 3.7 K/uL (ref 1.7–7.7)
Neutrophils Relative %: 56 %
Platelets: 423 K/uL — ABNORMAL HIGH (ref 150–400)
RBC: 4.08 MIL/uL (ref 3.87–5.11)
RDW: 14.9 % (ref 11.5–15.5)
WBC: 6.5 K/uL (ref 4.0–10.5)

## 2015-12-03 LAB — COMPREHENSIVE METABOLIC PANEL WITH GFR
ALT: 15 U/L (ref 14–54)
AST: 23 U/L (ref 15–41)
Albumin: 4.3 g/dL (ref 3.5–5.0)
Alkaline Phosphatase: 54 U/L (ref 38–126)
Anion gap: 7 (ref 5–15)
BUN: 9 mg/dL (ref 6–20)
CO2: 23 mmol/L (ref 22–32)
Calcium: 9.6 mg/dL (ref 8.9–10.3)
Chloride: 111 mmol/L (ref 101–111)
Creatinine, Ser: 0.68 mg/dL (ref 0.44–1.00)
GFR calc Af Amer: >60 mL/min
GFR calc non Af Amer: >60 mL/min
Glucose, Bld: 101 mg/dL — ABNORMAL HIGH (ref 65–99)
Potassium: 3.9 mmol/L (ref 3.5–5.1)
Sodium: 141 mmol/L (ref 135–145)
Total Bilirubin: 0.9 mg/dL (ref 0.3–1.2)
Total Protein: 7.7 g/dL (ref 6.5–8.1)

## 2015-12-03 LAB — URINALYSIS, ROUTINE W REFLEX MICROSCOPIC
BILIRUBIN URINE: NEGATIVE
Glucose, UA: NEGATIVE mg/dL
Hgb urine dipstick: NEGATIVE
Ketones, ur: NEGATIVE mg/dL
LEUKOCYTES UA: NEGATIVE
NITRITE: NEGATIVE
PH: 6.5 (ref 5.0–8.0)
Protein, ur: NEGATIVE mg/dL
SPECIFIC GRAVITY, URINE: 1.02 (ref 1.005–1.030)

## 2015-12-03 LAB — I-STAT TROPONIN, ED: Troponin i, poc: 0 ng/mL (ref 0.00–0.08)

## 2015-12-03 LAB — LIPASE, BLOOD: Lipase: 31 U/L (ref 11–51)

## 2015-12-03 MED ORDER — SODIUM CHLORIDE 0.9 % IV BOLUS (SEPSIS)
1000.0000 mL | Freq: Once | INTRAVENOUS | Status: AC
  Administered 2015-12-03: 1000 mL via INTRAVENOUS

## 2015-12-03 MED ORDER — AMLODIPINE BESYLATE 5 MG PO TABS
10.0000 mg | ORAL_TABLET | Freq: Once | ORAL | Status: AC
  Administered 2015-12-03: 10 mg via ORAL
  Filled 2015-12-03: qty 2

## 2015-12-03 MED ORDER — ONDANSETRON HCL 4 MG/2ML IJ SOLN
4.0000 mg | Freq: Once | INTRAMUSCULAR | Status: AC
  Administered 2015-12-03: 4 mg via INTRAVENOUS
  Filled 2015-12-03: qty 2

## 2015-12-03 NOTE — ED Notes (Signed)
Pt tolerated PO challenge well. Ambulates with no problem to the rr.

## 2015-12-03 NOTE — ED Triage Notes (Signed)
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  States the room was hot, she became dizzy and was assisted to floor by staff.  States initially both eyes were blurred, but now only feels blurred vision to R eye. AO x 4.

## 2015-12-03 NOTE — ED Provider Notes (Signed)
[facility]EM[facility]Y DEPT Provider Note   CSN: [id number] Arrival date & time: 12/03/15  0849     History   Chief Complaint Chief Complaint  Patient presents with  . Near Syncope  . Blurred Vision    HPI Tina Hensley is a 50 y.o. female.  50 year old female with a history of hypertension who presents with vomiting and near syncope. Patient states that around 2:30 AM today, she began feeling sick to her stomach and had multiple episodes of vomiting. She denies any associated abdominal pain or diarrhea. She went to work this morning, still feeling nauseated. 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 She sat down and her coworker put a washcloth on her forehead. She reports blurry vision during the near syncope episode which eventually waned to only right eye blurry vision. Currently her vision is normal. She denies any headache, extremity numbness/weakness, chest pain, urinary symptoms, or ongoing shortness of breath. No sick contacts or recent travel. No new medications or unusual foods. She has not had her blood pressure medications this morning.   The history is provided by the patient.  Near Syncope     Past Medical History:  Diagnosis Date  . Hypertension     There are no active problems to display for this patient.   History reviewed. No pertinent surgical history.  OB History    No data available       Home Medications    Prior to Admission medications   Medication Sig Start Date End Date Taking? Authorizing Provider  amLODipine (NORVASC) 10 MG tablet Take 1 tablet (10 mg total) by mouth daily. Patient not taking: Reported on 11/29/2015 08/19/15   Waylan BogaAlexandra M Law, PA-C  predniSONE (DELTASONE) 20 MG tablet Take 3 tablets (60 mg total) by mouth daily. Patient not taking: Reported on 11/29/2015 08/19/15   Emi HolesAlexandra M Law, PA-C  promethazine (PHENERGAN) 25  MG tablet Take 1 tablet (25 mg total) by mouth every 8 (eight) hours as needed for nausea or vomiting. Patient not taking: Reported on 04/14/2015 04/14/15   Charlestine Nighthristopher Lawyer, PA-C    Family History No family history on file.  Social History Social History  Substance Use Topics  . Smoking status: Former Smoker    Packs/day: 0.00  . Smokeless tobacco: Never Used  . Alcohol use 0.6 oz/week    1 Cans of beer per week     Allergies   Review of patient's allergies indicates no known allergies.   Review of Systems Review of Systems  Cardiovascular: Positive for near-syncope.   10 Systems reviewed and are negative for acute change except as noted in the HPI.   Physical Exam Updated Vital Signs BP 150/99   Pulse 83   Temp 99.2 F (37.3 C) (Oral)   Resp 17   Ht 5\' 5"  (1.651 m)   Wt 125 lb (56.7 kg)   LMP 11/04/2015   SpO2 100%   BMI 20.80 kg/m   Physical Exam  Constitutional: She is oriented to person, place, and time. She appears well-developed and well-nourished. No distress.  Awake, alert  HENT:  Head: Normocephalic and atraumatic.  Eyes: Conjunctivae and EOM are normal. Pupils are equal, round, and reactive to light.  Neck: Neck supple.  Cardiovascular: Normal rate, regular rhythm and normal heart sounds.   No murmur heard. Pulmonary/Chest: Effort normal and breath sounds normal. No respiratory  distress.  Abdominal: Soft. Bowel sounds are normal. She exhibits no distension. There is no tenderness.  Musculoskeletal: She exhibits no edema.  Neurological: She is alert and oriented to person, place, and time. She has normal reflexes. No cranial nerve deficit. She exhibits normal muscle tone.  Fluent speech, normal finger-to-nose testing, negative pronator drift, no clonus 5/5 strength and normal sensation x all 4 extremities  Skin: Skin is warm and dry.  Psychiatric: She has a normal mood and affect. Judgment and thought content normal.  Nursing note and vitals  reviewed.    ED Treatments / Results  Labs (all labs ordered are listed, but only abnormal results are displayed) Labs Reviewed  COMPREHENSIVE METABOLIC PANEL - Abnormal; Notable for the following:       Result Value   Glucose, Bld 101 (*)    All other components within normal limits  CBC WITH DIFFERENTIAL/PLATELET - Abnormal; Notable for the following:    Platelets 423 (*)    All other components within normal limits  URINALYSIS, ROUTINE W REFLEX MICROSCOPIC (NOT AT [facility]) - Abnormal; Notable for the following:    APPearance HAZY (*)    All other components within normal limits  LIPASE, BLOOD  I-STAT TROPOININ, ED    EKG  EKG Interpretation  Date/Time:  Thursday December 03 2015 09:43:24 EDT Ventricular Rate:  72 PR Interval:    QRS Duration: 78 QT Interval:  393 QTC Calculation: 431 R Axis:   55 Text Interpretation:  Sinus rhythm No significant change since last tracing Confirmed by LITTLE MD, RACHEL ([id number]) on 12/03/2015 10:24:07 AM       Radiology No results found.  Procedures Procedures (including critical care time)  Medications Ordered in ED Medications  sodium chloride 0.9 % bolus 1,000 mL (0 mLs Intravenous Stopped 12/03/15 1151)  ondansetron (ZOFRAN) injection 4 mg (4 mg Intravenous Given 12/03/15 0954)  amLODipine (NORVASC) tablet 10 mg (10 mg Oral Given 12/03/15 1049)     Initial Impression / Assessment and Plan / ED Course  I have reviewed the triage vital signs and the nursing notes.  Pertinent labs that were available during my care of the patient were reviewed by me and considered in my medical decision making (see chart for details).  Clinical Course   PT w/ near syncope episode at work this morning for multiple episodes of vomiting overnight. Patient was awake and alert, neurologically intact on exam. Vital signs notable for hypertension at 192/109, no medications this morning. No abdominal tenderness and normal neurologic exam. Gave the patient  an IV fluid bolus and Zofran, obtained above lab work to evaluate for signs of dehydration.  The patient's description of blurry vision, lightheadedness, and shortness of breath  Leading up to near syncope in setting of vomiting suggests the episode may have been related to dehydration from vomiting. She has a normal neurologic exam and no ongoing symptoms therefore I do not feel she needs any further neurologic workup. She is had no chest pain and a reassuring cardiac workup here therefore I do not feel she needs any further cardiac workup currently. I discussed supportive care including continued hydration. Reviewed return precautions including chest pain or any neurologic symptoms. Patient voiced understanding and was discharged in satisfactory condition.  Final Clinical Impressions(s) / ED Diagnoses   Final diagnoses:  Near syncope  Non-intractable vomiting with nausea, vomiting of unspecified type    New Prescriptions Discharge Medication List as of 12/03/2015  1:08 PM  Laurence Spates, MD 12/03/15 [phone]

## 2015-12-03 NOTE — ED Notes (Signed)
PT is in stable condition upon d/c and ambulates from ED. 

## 2016-06-02 ENCOUNTER — Emergency Department (HOSPITAL_COMMUNITY)
Admission: EM | Admit: 2016-06-02 | Discharge: 2016-06-02 | Disposition: A | Discharge status: Home / Self Care | Payer: Self-pay | Attending: Emergency Medicine | Admitting: Emergency Medicine

## 2016-06-02 ENCOUNTER — Encounter (HOSPITAL_COMMUNITY): Payer: Self-pay | Admitting: Emergency Medicine

## 2016-06-02 ENCOUNTER — Emergency Department (HOSPITAL_COMMUNITY): Payer: Self-pay

## 2016-06-02 DIAGNOSIS — Z87891 Personal history of nicotine dependence: Secondary | ICD-10-CM | POA: Insufficient documentation

## 2016-06-02 DIAGNOSIS — R55 Syncope and collapse: Secondary | ICD-10-CM | POA: Insufficient documentation

## 2016-06-02 DIAGNOSIS — Z79899 Other long term (current) drug therapy: Secondary | ICD-10-CM | POA: Insufficient documentation

## 2016-06-02 DIAGNOSIS — I10 Essential (primary) hypertension: Secondary | ICD-10-CM | POA: Insufficient documentation

## 2016-06-02 LAB — BASIC METABOLIC PANEL
ANION GAP: 9 (ref 5–15)
BUN: 10 mg/dL (ref 6–20)
CALCIUM: 8.9 mg/dL (ref 8.9–10.3)
CHLORIDE: 107 mmol/L (ref 101–111)
CO2: 22 mmol/L (ref 22–32)
CREATININE: 0.53 mg/dL (ref 0.44–1.00)
GFR calc non Af Amer: >60 mL/min (ref >60)
Glucose, Bld: 85 mg/dL (ref 65–99)
Potassium: 3.7 mmol/L (ref 3.5–5.1)
SODIUM: 138 mmol/L (ref 135–145)

## 2016-06-02 LAB — CBC WITH DIFFERENTIAL/PLATELET
BASOS ABS: 0 10*3/uL (ref 0.0–0.1)
BASOS PCT: 0 %
EOS ABS: 0.2 10*3/uL (ref 0.0–0.7)
Eosinophils Relative: 4 %
HEMATOCRIT: 38.1 % (ref 36.0–46.0)
HEMOGLOBIN: 12.8 g/dL (ref 12.0–15.0)
Lymphocytes Relative: 49 %
Lymphs Abs: 2.5 10*3/uL (ref 0.7–4.0)
MCH: 31.4 pg (ref 26.0–34.0)
MCHC: 33.6 g/dL (ref 30.0–36.0)
MCV: 93.4 fL (ref 78.0–100.0)
MONOS PCT: 7 %
Monocytes Absolute: 0.4 10*3/uL (ref 0.1–1.0)
Neutro Abs: 2.1 10*3/uL (ref 1.7–7.7)
Neutrophils Relative %: 40 %
Platelets: 337 10*3/uL (ref 150–400)
RBC: 4.08 MIL/uL (ref 3.87–5.11)
RDW: 14.6 % (ref 11.5–15.5)
WBC: 5.1 10*3/uL (ref 4.0–10.5)

## 2016-06-02 MED ORDER — AMLODIPINE BESYLATE 10 MG PO TABS: 10.0000 mg | ORAL_TABLET | Freq: Every day | ORAL | 1 refills | Status: DC

## 2016-06-02 NOTE — ED Triage Notes (Addendum)
Pt reports she only takes htn meds sporadically. 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 No chest pain, SOB, or other complaints. No longer symptomatic. BP per EMS 151/92. No longer has headache. Pt states has had incident like this in past.

## 2016-06-02 NOTE — ED Notes (Signed)
Pt updated on POC; no needs.

## 2016-06-02 NOTE — ED Provider Notes (Signed)
[facility] Provider Note   CSN: [id number] Arrival date & time: 06/02/16  1028     History   Chief Complaint Chief Complaint  Patient presents with  . Hypertension    HPI Tina Hensley is a 51 y.o. female.  Patient was at work at [employer]. Symptoms were episodic and now resolved. Patient became hot and dizzy with slight headache. No chest pain no shortness of breath. Headache is now resolved all symptoms have resolved. Patient felt as if she was going to pass out. Patient supposed to be on hypertensive meds but has been noncompliant. Currently does not have a primary care provider.      Past Medical History:  Diagnosis Date  . Hypertension     There are no active problems to display for this patient.   History reviewed. No pertinent surgical history.  OB History    No data available       Home Medications    Prior to Admission medications   Medication Sig Start Date End Date Taking? Authorizing Provider  amLODipine (NORVASC) 10 MG tablet Take 1 tablet (10 mg total) by mouth daily. Patient not taking: Reported on 11/29/2015 08/19/15   Waylan Boga Law, PA-C  amLODipine (NORVASC) 10 MG tablet Take 1 tablet (10 mg total) by mouth daily. 06/02/16   Vanetta Mulders, MD  predniSONE (DELTASONE) 20 MG tablet Take 3 tablets (60 mg total) by mouth daily. Patient not taking: Reported on 11/29/2015 08/19/15   Emi Holes, PA-C  promethazine (PHENERGAN) 25 MG tablet Take 1 tablet (25 mg total) by mouth every 8 (eight) hours as needed for nausea or vomiting. Patient not taking: Reported on 04/14/2015 04/14/15   Charlestine Night, PA-C    Family History History reviewed. No pertinent family history.  Social History Social History  Substance Use Topics  . Smoking status: Former Smoker    Packs/day: 0.00  . Smokeless tobacco: Never Used  . Alcohol use 0.6 oz/week    1 Cans of beer per week     Allergies   Patient has no known allergies.   Review of  Systems Review of Systems  Constitutional: Positive for fatigue. Negative for fever.  HENT: Negative for congestion.   Eyes: Negative for pain.  Respiratory: Negative for shortness of breath.   Cardiovascular: Negative for chest pain.  Gastrointestinal: Negative for abdominal pain.  Genitourinary: Negative for dysuria.  Musculoskeletal: Negative for back pain.  Neurological: Positive for dizziness and headaches.  Hematological: Does not bruise/bleed easily.  Psychiatric/Behavioral: Negative for confusion.     Physical Exam Updated Vital Signs BP 131/87   Pulse 76   Temp 98 F (36.7 C) (Oral)   Resp (!) 21   Ht 5\' 5"  (1.651 m)   Wt 65.8 kg   SpO2 100%   BMI 24.13 kg/m   Physical Exam  Constitutional: She is oriented to person, place, and time. She appears well-developed and well-nourished. No distress.  HENT:  Head: Normocephalic and atraumatic.  Mouth/Throat: Oropharynx is clear and moist.  Eyes: Conjunctivae and EOM are normal. Pupils are equal, round, and reactive to light.  Neck: Normal range of motion. Neck supple.  Cardiovascular: Normal rate, regular rhythm and normal heart sounds.   Pulmonary/Chest: Effort normal and breath sounds normal. No respiratory distress.  Abdominal: Soft. Bowel sounds are normal. There is no tenderness.  Musculoskeletal: Normal range of motion. She exhibits no edema.  Neurological: She is alert and oriented to person, place, and time. No cranial nerve  deficit or sensory deficit. She exhibits normal muscle tone. Coordination normal.  Skin: Skin is warm. No rash noted.  Nursing note and vitals reviewed.    ED Treatments / Results  Labs (all labs ordered are listed, but only abnormal results are displayed) Labs Reviewed  CBC WITH DIFFERENTIAL/PLATELET  BASIC METABOLIC PANEL    EKG  EKG Interpretation None      ED ECG REPORT   Date: 06/02/2016  Rate: 69  Rhythm: normal sinus rhythm  QRS Axis: normal  Intervals: normal   ST/T Wave abnormalities: normal  Conduction Disutrbances:none  Narrative Interpretation:   Old EKG Reviewed: none available  I have personally reviewed the EKG tracing and agree with the computerized printout as noted.   Radiology Dg Chest 2 View  Result Date: 06/02/2016 CLINICAL DATA:  Near syncopal episode EXAM: CHEST  2 VIEW COMPARISON:  11/29/2015 FINDINGS: The heart size and mediastinal contours are within normal limits. Both lungs are clear. The visualized skeletal structures are unremarkable. IMPRESSION: No active cardiopulmonary disease. Electronically Signed   By: Alcide Clever M.D.   On: 06/02/2016 13:01   Results for orders placed or performed during the hospital encounter of 06/02/16  CBC with Differential/Platelet  Result Value Ref Range   WBC 5.1 4.0 - 10.5 K/uL   RBC 4.08 3.87 - 5.11 MIL/uL   Hemoglobin 12.8 12.0 - 15.0 g/dL   HCT 16.1 09.6 - 04.5 %   MCV 93.4 78.0 - 100.0 fL   MCH 31.4 26.0 - 34.0 pg   MCHC 33.6 30.0 - 36.0 g/dL   RDW 40.9 81.1 - 91.4 %   Platelets 337 150 - 400 K/uL   Neutrophils Relative % 40 %   Neutro Abs 2.1 1.7 - 7.7 K/uL   Lymphocytes Relative 49 %   Lymphs Abs 2.5 0.7 - 4.0 K/uL   Monocytes Relative 7 %   Monocytes Absolute 0.4 0.1 - 1.0 K/uL   Eosinophils Relative 4 %   Eosinophils Absolute 0.2 0.0 - 0.7 K/uL   Basophils Relative 0 %   Basophils Absolute 0.0 0.0 - 0.1 K/uL  Basic metabolic panel  Result Value Ref Range   Sodium 138 135 - 145 mmol/L   Potassium 3.7 3.5 - 5.1 mmol/L   Chloride 107 101 - 111 mmol/L   CO2 22 22 - 32 mmol/L   Glucose, Bld 85 65 - 99 mg/dL   BUN 10 6 - 20 mg/dL   Creatinine, Ser 7.82 0.44 - 1.00 mg/dL   Calcium 8.9 8.9 - 95.6 mg/dL   GFR calc non Af Amer >60 >60 mL/min   GFR calc Af Amer >60 >60 mL/min   Anion gap 9 5 - 15    Procedures Procedures (including critical care time)  Medications Ordered in ED Medications - No data to display   Initial Impression / Assessment and Plan / ED Course   I have reviewed the triage vital signs and the nursing notes.  Pertinent labs & imaging results that were available during my care of the patient were reviewed by me and considered in my medical decision making (see chart for details).    Patient with a near syncopal episode today. Patient now asymptomatic. Workup here without any acute findings. Patient also with history of hypertension long-standing supposed to be on Norvasc but has been noncompliant.  Patient stable for discharge home given wellness clinic for follow-up will restart the Norvasc. Work note provided   Final Clinical Impressions(s) / ED Diagnoses  Final diagnoses:  Near syncope  Essential hypertension    New Prescriptions New Prescriptions   AMLODIPINE (NORVASC) 10 MG TABLET    Take 1 tablet (10 mg total) by mouth daily.     Vanetta MuldersScott Gerren Hoffmeier, MD 06/02/16 1429

## 2016-06-02 NOTE — Discharge Instructions (Signed)
Return for any new or worse symptoms. Return for any passing out. Make an appointment to follow-up with the wellness clinic to have your blood pressure recheck. Restart sure Norvasc. Work note provided.

## 2016-06-02 NOTE — ED Notes (Signed)
Pt resting and no needs.

## 2016-06-02 NOTE — ED Notes (Signed)
Patient transported to X-ray 

## 2016-10-30 ENCOUNTER — Encounter (HOSPITAL_COMMUNITY): Payer: Self-pay | Admitting: Emergency Medicine

## 2016-10-30 ENCOUNTER — Emergency Department (HOSPITAL_COMMUNITY)
Admission: EM | Admit: 2016-10-30 | Discharge: 2016-10-30 | Disposition: A | Discharge status: Home / Self Care | Payer: Self-pay | Attending: Emergency Medicine | Admitting: Emergency Medicine

## 2016-10-30 DIAGNOSIS — I10 Essential (primary) hypertension: Secondary | ICD-10-CM | POA: Insufficient documentation

## 2016-10-30 DIAGNOSIS — Z79899 Other long term (current) drug therapy: Secondary | ICD-10-CM | POA: Insufficient documentation

## 2016-10-30 DIAGNOSIS — W57XXXA Bitten or stung by nonvenomous insect and other nonvenomous arthropods, initial encounter: Secondary | ICD-10-CM

## 2016-10-30 DIAGNOSIS — L03116 Cellulitis of left lower limb: Secondary | ICD-10-CM

## 2016-10-30 DIAGNOSIS — Z87891 Personal history of nicotine dependence: Secondary | ICD-10-CM | POA: Insufficient documentation

## 2016-10-30 MED ORDER — IBUPROFEN 800 MG PO TABS: 800.0000 mg | ORAL_TABLET | Freq: Three times a day (TID) | ORAL | 0 refills | Status: DC | PRN

## 2016-10-30 MED ORDER — CEPHALEXIN 500 MG PO CAPS: 1000.0000 mg | ORAL_CAPSULE | Freq: Two times a day (BID) | ORAL | 0 refills | Status: DC

## 2016-10-30 NOTE — ED Provider Notes (Signed)
[facility] Provider Note   CSN: [id number] Arrival date & time: 10/30/16  0645     History   Chief Complaint Chief Complaint  Patient presents with  . Insect Bite    HPI Resa Urias is a 51 y.o. female.  HPI Patient presents to the emergency department with swelling to her left ankle with some mild redness and a small blistered area.  The patient states that she has not sure if something bit her.  She states that she has been out since of times recently.  She noticed the swelling started yesterday morning.  He attempted to use Benadryl which made her sleepy.  She also states that she tried alcohol on the area, which seemed to help somewhat, but not substantially.  Patient states that she did not take any other medications prior to arrivalThe patient denies chest pain, shortness of breath, headache,blurred vision, neck pain, fever, cough, weakness, numbness, dizziness, anorexia, edema, abdominal pain, nausea, vomiting, diarrhea, rash, back pain, dysuria, hematemesis, bloody stool, near syncope, or syncope. Past Medical History:  Diagnosis Date  . Hypertension     There are no active problems to display for this patient.   History reviewed. No pertinent surgical history.  OB History    No data available       Home Medications    Prior to Admission medications   Medication Sig Start Date End Date Taking? Authorizing Provider  amLODipine (NORVASC) 10 MG tablet Take 1 tablet (10 mg total) by mouth daily. Patient not taking: Reported on 11/29/2015 08/19/15   Emi Holes, PA-C  amLODipine (NORVASC) 10 MG tablet Take 1 tablet (10 mg total) by mouth daily. 06/02/16   Vanetta Mulders, MD  predniSONE (DELTASONE) 20 MG tablet Take 3 tablets (60 mg total) by mouth daily. Patient not taking: Reported on 11/29/2015 08/19/15   Emi Holes, PA-C  promethazine (PHENERGAN) 25 MG tablet Take 1 tablet (25 mg total) by mouth every 8 (eight) hours as needed for nausea or  vomiting. Patient not taking: Reported on 04/14/2015 04/14/15   Charlestine Night, PA-C    Family History No family history on file.  Social History Social History  Substance Use Topics  . Smoking status: Former Smoker    Packs/day: 0.00  . Smokeless tobacco: Never Used  . Alcohol use 0.6 oz/week    1 Cans of beer per week     Allergies   Patient has no known allergies.   Review of Systems Review of Systems All other systems negative except as documented in the HPI. All pertinent positives and negatives as reviewed in the HPI.  Physical Exam Updated Vital Signs BP (!) 167/101   Pulse 89   Temp 98.2 F (36.8 C) (Oral)   Resp 18   Ht 5\' 5"  (1.651 m)   Wt 65.8 kg (145 lb)   SpO2 97%   BMI 24.13 kg/m   Physical Exam  Constitutional: She is oriented to person, place, and time. She appears well-developed and well-nourished. No distress.  HENT:  Head: Normocephalic and atraumatic.  Eyes: Pupils are equal, round, and reactive to light.  Pulmonary/Chest: Effort normal.  Musculoskeletal:       Feet:  Neurological: She is alert and oriented to person, place, and time.  Skin: Skin is warm and dry.  Psychiatric: She has a normal mood and affect.  Nursing note and vitals reviewed.    ED Treatments / Results  Labs (all labs ordered are listed, but only abnormal results  are displayed) Labs Reviewed - No data to display  EKG  EKG Interpretation None       Radiology No results found.  Procedures Procedures (including critical care time)  Medications Ordered in ED Medications - No data to display   Initial Impression / Assessment and Plan / ED Course  I have reviewed the triage vital signs and the nursing notes.  Pertinent labs & imaging results that were available during my care of the patient were reviewed by me and considered in my medical decision making (see chart for details).     Patient be told to ice and elevate the ankle and foot and I will  also apply an Ace wrap, told her that if the area.  It is not improved by Monday afternoon to start the Keflex.  Patient is advised return here as needed.  Patient agrees the plan and all questions were answered  Final Clinical Impressions(s) / ED Diagnoses   Final diagnoses:  None    New Prescriptions New Prescriptions   No medications on file     Charlestine NightLawyer, Bina Veenstra, Cordelia Poche-C 10/30/16 [phone]    Shaune PollackIsaacs, Cameron, MD 11/01/16 1241

## 2016-10-30 NOTE — ED Triage Notes (Signed)
Reports thinking something bit her left ankle.  Woke up this  Morning with significant swelling and what appears to be a small bite or blister.  Reports pain with walking.

## 2016-10-30 NOTE — ED Notes (Signed)
Ace wrap applied to pt left foot/ankle.

## 2016-10-30 NOTE — Discharge Instructions (Signed)
Return here as needed.  Start the antibiotic tomorrow afternoon if the area is not improving.  Ice and elevate

## 2017-06-13 ENCOUNTER — Emergency Department (HOSPITAL_COMMUNITY)
Admission: EM | Admit: 2017-06-13 | Discharge: 2017-06-13 | Disposition: A | Discharge status: Home / Self Care | Payer: Self-pay | Attending: Emergency Medicine | Admitting: Emergency Medicine

## 2017-06-13 ENCOUNTER — Encounter (HOSPITAL_COMMUNITY): Payer: Self-pay

## 2017-06-13 ENCOUNTER — Other Ambulatory Visit: Payer: Self-pay

## 2017-06-13 DIAGNOSIS — Z87891 Personal history of nicotine dependence: Secondary | ICD-10-CM | POA: Insufficient documentation

## 2017-06-13 DIAGNOSIS — I10 Essential (primary) hypertension: Secondary | ICD-10-CM | POA: Insufficient documentation

## 2017-06-13 DIAGNOSIS — Z79899 Other long term (current) drug therapy: Secondary | ICD-10-CM | POA: Insufficient documentation

## 2017-06-13 DIAGNOSIS — J069 Acute upper respiratory infection, unspecified: Secondary | ICD-10-CM | POA: Insufficient documentation

## 2017-06-13 DIAGNOSIS — J029 Acute pharyngitis, unspecified: Secondary | ICD-10-CM

## 2017-06-13 LAB — RAPID STREP SCREEN (MED CTR MEBANE ONLY): Streptococcus, Group A Screen (Direct): NEGATIVE

## 2017-06-13 MED ORDER — DIPHENHYDRAMINE HCL 50 MG/ML IJ SOLN
25.0000 mg | Freq: Once | INTRAMUSCULAR | Status: AC
  Administered 2017-06-13: 25 mg via INTRAVENOUS
  Filled 2017-06-13: qty 1

## 2017-06-13 MED ORDER — AMLODIPINE BESYLATE 5 MG PO TABS
10.0000 mg | ORAL_TABLET | Freq: Once | ORAL | Status: AC
  Administered 2017-06-13: 10 mg via ORAL
  Filled 2017-06-13: qty 2

## 2017-06-13 MED ORDER — PROCHLORPERAZINE EDISYLATE 5 MG/ML IJ SOLN
10.0000 mg | Freq: Once | INTRAMUSCULAR | Status: AC
  Administered 2017-06-13: 10 mg via INTRAVENOUS
  Filled 2017-06-13: qty 2

## 2017-06-13 MED ORDER — AMLODIPINE BESYLATE 10 MG PO TABS: 10.0000 mg | ORAL_TABLET | Freq: Every day | ORAL | 1 refills | Status: DC

## 2017-06-13 MED ORDER — ACETAMINOPHEN 500 MG PO TABS
1000.0000 mg | ORAL_TABLET | Freq: Once | ORAL | Status: AC
  Administered 2017-06-13: 1000 mg via ORAL
  Filled 2017-06-13: qty 2

## 2017-06-13 MED ORDER — LIDOCAINE VISCOUS 2 % MT SOLN
15.0000 mL | Freq: Once | OROMUCOSAL | Status: AC
  Administered 2017-06-13: 15 mL via OROMUCOSAL
  Filled 2017-06-13: qty 15

## 2017-06-13 MED ORDER — SODIUM CHLORIDE 0.9 % IV BOLUS
1000.0000 mL | Freq: Once | INTRAVENOUS | Status: AC
  Administered 2017-06-13: 1000 mL via INTRAVENOUS

## 2017-06-13 MED ORDER — KETOROLAC TROMETHAMINE 30 MG/ML IJ SOLN
30.0000 mg | Freq: Once | INTRAMUSCULAR | Status: AC
  Administered 2017-06-13: 30 mg via INTRAVENOUS
  Filled 2017-06-13: qty 1

## 2017-06-13 NOTE — ED Triage Notes (Signed)
Pt reports throat pain since Sunday with headache and difficulty swallowing.

## 2017-06-13 NOTE — Discharge Instructions (Signed)
Your symptoms are likely caused by a viral upper respiratory infection. Antibiotics are not helpful in treating viral infection, the virus should run its course in about 5-7 days. Please make sure you are drinking plenty of fluids. You can treat your symptoms supportively with tylenol/ibuprofen for fevers and pains, Zyrtec and Flonase to help with nasal congestion, and over the mucinex and cepacol throat lozenges to help with cough. If your symptoms are not improving please follow up with you Primary doctor.   If you develop persistent fevers, shortness of breath or difficulty breathing, chest pain, severe headache and neck pain, persistent nausea and vomiting or other new or concerning symptoms return to the Emergency department.  Your blood pressure was elevated today, please follow up with your primary doctor in 1 week for blood pressure recheck, please take your amlodipine regularly.

## 2017-06-13 NOTE — ED Provider Notes (Signed)
[facility] EMERGENCY DEPARTMENT Provider Note   CSN: [id number] Arrival date & time: 06/13/17  1613     History   Chief Complaint Chief Complaint  Patient presents with  . Sore Throat    HPI Tina Hensley is a 52 y.o. female.  Tina Hensley is a 52 y.o. Female with a history of hypertension, who presents to the ED for evaluation of 3 days of sore throat.  Patient reports pain is constant aching, worse with swallowing.  Patient reports yesterday she began to develop some rhinorrhea and dry cough.  She reports subjective chills, no fevers.  Patient also reports today around 1 PM she started getting a severe headache, she reports this developed gradually and was not maximal intensity and onset, she has had similar headaches in the past when her blood pressure has been high.  Patient reports she is supposed to take amlodipine but frequently skips her dose and has not taken it for the past few days.  She reports feeling a bit lightheaded but reports she has not been eating or drinking much for the past few days due to throat pain.  She tried gargling with some salt water today but has not tried anything else to treat her symptoms, no ibuprofen or Tylenol, no throat lozenges or warm teas.  Patient denies chest pain or shortness of breath, reports she has been spitting up some saliva due to pain with swallowing, denies abdominal pain, nausea or diarrhea.  No known sick contacts.     Past Medical History:  Diagnosis Date  . Hypertension     There are no active problems to display for this patient.   History reviewed. No pertinent surgical history.   OB History   None      Home Medications    Prior to Admission medications   Medication Sig Start Date End Date Taking? Authorizing Provider  amLODipine (NORVASC) 10 MG tablet Take 1 tablet (10 mg total) by mouth daily. Patient not taking: Reported on 11/29/2015 08/19/15   Emi Holes, PA-C  amLODipine (NORVASC) 10 MG  tablet Take 1 tablet (10 mg total) by mouth daily. 06/02/16   Vanetta Mulders, MD  cephALEXin (KEFLEX) 500 MG capsule Take 2 capsules (1,000 mg total) by mouth 2 (two) times daily. 10/30/16   Lawyer, Cristal Deer, PA-C  ibuprofen (ADVIL,MOTRIN) 800 MG tablet Take 1 tablet (800 mg total) by mouth every 8 (eight) hours as needed. 10/30/16   Lawyer, Cristal Deer, PA-C  predniSONE (DELTASONE) 20 MG tablet Take 3 tablets (60 mg total) by mouth daily. Patient not taking: Reported on 11/29/2015 08/19/15   Emi Holes, PA-C  promethazine (PHENERGAN) 25 MG tablet Take 1 tablet (25 mg total) by mouth every 8 (eight) hours as needed for nausea or vomiting. Patient not taking: Reported on 04/14/2015 04/14/15   Charlestine Night, PA-C    Family History No family history on file.  Social History Social History   Tobacco Use  . Smoking status: Former Smoker    Packs/day: 0.00  . Smokeless tobacco: Never Used  Substance Use Topics  . Alcohol use: Yes    Alcohol/week: 0.6 oz    Types: 1 Cans of beer per week  . Drug use: Yes    Types: Marijuana     Allergies   Patient has no known allergies.   Review of Systems Review of Systems  Constitutional: Positive for chills. Negative for fever.  HENT: Positive for congestion, postnasal drip, rhinorrhea, sore throat and trouble  swallowing. Negative for drooling, ear discharge, ear pain, sinus pressure and sinus pain.   Eyes: Negative for visual disturbance.  Respiratory: Negative for cough, shortness of breath and wheezing.   Cardiovascular: Negative for chest pain.  Gastrointestinal: Positive for vomiting. Negative for abdominal pain, diarrhea and nausea.  Musculoskeletal: Negative for arthralgias and myalgias.  Skin: Negative for color change and rash.  Neurological: Positive for light-headedness and headaches. Negative for dizziness, tremors, syncope, facial asymmetry, speech difficulty, weakness and numbness.     Physical Exam Updated Vital  Signs BP (!) 183/100 (BP Location: Right Arm)   Pulse 92   Temp 98.4 F (36.9 C) (Oral)   Resp 16   LMP 05/19/2017   SpO2 100%   Physical Exam  Constitutional: She is oriented to person, place, and time. She appears well-developed and well-nourished. No distress.  Pt appears mildly uncomfortable but in NAD  HENT:  Head: Normocephalic and atraumatic.  Right Ear: Tympanic membrane normal.  Left Ear: Tympanic membrane normal.  Mouth/Throat: Uvula is midline and mucous membranes are normal. No uvula swelling. Posterior oropharyngeal edema and posterior oropharyngeal erythema present. No oropharyngeal exudate or tonsillar abscesses. Tonsils are 2+ on the right. Tonsils are 2+ on the left. No tonsillar exudate.  TMs clear with good landmarks, moderate nasal mucosa edema with clear rhinorrhea, posterior oropharynx clear and moist, with some erythema, mild edema, no exudates, uvula midline, no evidence of PTA or RPA  Eyes: Right eye exhibits no discharge. Left eye exhibits no discharge.  Neck: Normal range of motion. Neck supple.  No rigidity, no stridor  Cardiovascular: Normal rate, regular rhythm, normal heart sounds and intact distal pulses.  Pulmonary/Chest: Effort normal and breath sounds normal. No stridor. No respiratory distress. She has no wheezes. She has no rhonchi. She has no rales.  Respirations equal and unlabored, patient able to speak in full sentences, lungs clear to auscultation bilaterally  Abdominal: Soft. Bowel sounds are normal. She exhibits no distension. There is no tenderness.  Musculoskeletal: She exhibits no edema.  Lymphadenopathy:    She has cervical adenopathy.  Neurological: She is alert and oriented to person, place, and time. Coordination normal.  Speech is clear, able to follow commands CN III-XII intact Normal strength in upper and lower extremities bilaterally including dorsiflexion and plantar flexion, strong and equal grip strength Sensation normal to  light and sharp touch Moves extremities without ataxia, coordination intact Normal finger to nose and rapid alternating movements No pronator drift  Skin: Skin is warm and dry. She is not diaphoretic.  Psychiatric: She has a normal mood and affect. Her behavior is normal.  Nursing note and vitals reviewed.    ED Treatments / Results  Labs (all labs ordered are listed, but only abnormal results are displayed) Labs Reviewed  RAPID STREP SCREEN (NOT AT [facility])  CULTURE, GROUP A STREP [facility])    EKG None  Radiology No results found.  Procedures Procedures (including critical care time)  Medications Ordered in ED Medications  sodium chloride 0.9 % bolus 1,000 mL (0 mLs Intravenous Stopped 06/13/17 1819)  acetaminophen (TYLENOL) tablet 1,000 mg (1,000 mg Oral Given 06/13/17 1715)  lidocaine (XYLOCAINE) 2 % viscous mouth solution 15 mL (15 mLs Mouth/Throat Given 06/13/17 1716)  amLODipine (NORVASC) tablet 10 mg (10 mg Oral Given 06/13/17 1715)  ketorolac (TORADOL) 30 MG/ML injection 30 mg (30 mg Intravenous Given 06/13/17 1716)  prochlorperazine (COMPAZINE) injection 10 mg (10 mg Intravenous Given 06/13/17 1716)  diphenhydrAMINE (BENADRYL) injection 25 mg (25  mg Intravenous Given 06/13/17 1716)     Initial Impression / Assessment and Plan / ED Course  I have reviewed the triage vital signs and the nursing notes.  Pertinent labs & imaging results that were available during my care of the patient were reviewed by me and considered in my medical decision making (see chart for details).  Patient presents to the emergency department for evaluation of 3 days of sore throat, developed associated rhinorrhea and dry cough yesterday.  Rapid strep negative and culture pending.  Viral pharyngitis is likely, exam not concerning for PTA or RPA, uvula midline, patient tolerating fluids in the ED, no stridor on exam and no rigidity of the neck.  Patient has not been eating or drinking anything due to pain  with swallowing but has not taken anything for pain will give Tylenol and viscous lidocaine here in the ED and then give p.o. challenge  Patient noted to be a very hypertensive with blood pressure of 183/100 in triage, vitals otherwise normal.  Patient reports she is supposed to be taking 10 mg of amlodipine but is not compliant with her medication.  Patient denies any associated chest pain or shortness of breath, does report a headache, which she sometimes has when her blood pressure is elevated.  Patient denies any associated vision changes, she reports some mild lightheadedness but no dizziness.  No weakness, numbness or tingling in any of her extremities.  Patient with completely normal neurologic exam I have low suspicion for stroke, will plan to give patient's home blood pressure medications and headache cocktail and see if her symptoms and blood pressure improve. Doubt HTN emergency.  On reevaluation after medications patient reports complete resolution of her headache, and vast improvement in her sore throat.  We discussed appropriate symptomatic management.  Patient's blood pressure has also improved vastly now 149/93.  Will provide patient with refill of her blood pressure medication as she think she is running low.  Discussed appropriate symptomatic management with Tylenol, Flonase and Zyrtec, Mucinex and Cepacol throat lozenges.  Patient expresses understanding and is in agreement with plan she will [facility]-up with [facility] return precautions discussed.  Vitals:   06/13/17 1634 06/13/17 1714 06/13/17 1745 06/13/17 1815  BP: (!) 183/100 (!) 171/105 (!) 168/93 (!) 149/93  Pulse: 92  86 87  Resp: 16   16  Temp: 98.4 F (36.9 C)     TempSrc: Oral     SpO2: 100%  98% 97%     Final Clinical Impressions(s) / ED Diagnoses   Final diagnoses:  Sore throat  Viral upper respiratory tract infection  Hypertension, unspecified type    ED Discharge Orders         Ordered    amLODipine (NORVASC) 10 MG tablet  Daily     06/13/17 1853       Dartha LodgeFord, Twylah Bennetts N, PA-C 06/13/17 1854    Little, Ambrose Finlandachel Morgan, MD 06/15/17 [phone]

## 2017-06-15 LAB — CULTURE, GROUP A STREP (THRC)

## 2017-06-16 ENCOUNTER — Telehealth: Payer: Self-pay

## 2017-06-16 NOTE — Progress Notes (Signed)
ED Antimicrobial Stewardship Positive Culture Follow Up   Tina Hensley is an 52 y.o. female who [facility] with a chief complaint of  Chief Complaint  Patient presents with  . Sore Throat    Recent Results (from the past 720 hour(s))  Rapid strep screen     Status: None   Collection Time: 06/13/17  4:40 PM  Result Value Ref Range Status   Streptococcus, Group A Screen (Direct) NEGATIVE NEGATIVE Final    Comment: (NOTE) A Rapid Antigen test may result negative if the antigen level in the sample is below the detection level of this test. The FDA has not cleared this test as a stand-alone test therefore the rapid antigen negative result has reflexed to a Group A Strep culture. Performed at [facility], [address] [place], [place] [address]   Culture, group A strep     Status: None   Collection Time: 06/13/17  4:40 PM  Result Value Ref Range Status   Specimen Description THROAT  Final   Special Requests   Final    NONE Reflexed from [id number] Performed at [facility], [address] [place], [place] [address]    Culture MODERATE GROUP A STREP (S.PYOGENES) ISOLATED  Final   Report Status 06/15/2017 FINAL  Final    [x]  Patient discharged originally without antimicrobial agent and treatment is now indicated  New antibiotic prescription: Penicillin VK 500 mg po bid x 10 days  ED Provider: Leary RocaMichael Maczis, PA-C  Rolley SimsMartin, Jerusha Reising Ann 06/16/2017, 9:11 AM Infectious Diseases Pharmacist Phone# [phone]

## 2017-06-16 NOTE — Telephone Encounter (Signed)
Post ED Visit - Positive Culture Follow-up: Unsuccessful Patient Follow-up  Culture assessed and recommendations reviewed by:  []  Enzo BiNathan Batchelder, Pharm.D. []  Celedonio MiyamotoJeremy Frens, Pharm.D., BCPS AQ-ID []  Garvin FilaMike Maccia, Pharm.D., BCPS [x]  Georgina PillionElizabeth Martin, Pharm.D., BCPS []  BethelMinh Pham, [address]Pharm[address]D., BCPS, AAHIVP []  Estella HuskMichelle Turner, Pharm.D., BCPS, AAHIVP []  Lysle Pearlachel Rumbarger, PharmD, BCPS []  Casilda Carlsaylor Stone, PharmD, BCPS []  Pollyann SamplesAndy Johnston, PharmD, BCPS  Positive strep culture  [x]  Patient discharged without antimicrobial prescription and treatment is now indicated []  Organism is resistant to prescribed ED discharge antimicrobial []  Patient with positive blood cultures   Unable to contact patient after 3 attempts, letter will be sent to address on file  Jerry CarasCullom, Cormick Moss Burnett 06/16/2017, 10:34 AM

## 2017-12-29 ENCOUNTER — Encounter (HOSPITAL_COMMUNITY): Payer: Self-pay

## 2017-12-29 ENCOUNTER — Ambulatory Visit (HOSPITAL_COMMUNITY)
Admission: EM | Admit: 2017-12-29 | Discharge: 2017-12-29 | Disposition: A | Discharge status: Home / Self Care | Payer: Self-pay | Attending: Family Medicine | Admitting: Family Medicine

## 2017-12-29 DIAGNOSIS — L259 Unspecified contact dermatitis, unspecified cause: Secondary | ICD-10-CM

## 2017-12-29 MED ORDER — TRIAMCINOLONE ACETONIDE 0.1 % EX CREA: 1.0000 "application " | TOPICAL_CREAM | Freq: Two times a day (BID) | CUTANEOUS | 0 refills | Status: DC

## 2017-12-29 MED ORDER — CETIRIZINE HCL 10 MG PO TABS: 10.0000 mg | ORAL_TABLET | Freq: Every day | ORAL | 0 refills | Status: DC

## 2017-12-29 NOTE — Discharge Instructions (Addendum)
We will go ahead and treat you for contact dermatitis. Triamcinolone cream twice a day Zyrtec daily for itching Follow up as needed for continued or worsening symptoms

## 2017-12-29 NOTE — ED Provider Notes (Signed)
[facility]    CSN: [id number] Arrival date & time: 12/29/17  0840     History   Chief Complaint Chief Complaint  Patient presents with  . Rash    HPI Tina Hensley is a 52 y.o. female.   Patient is a 52 year old female that presents with rash for 4 days.  The rash started after her son washed her laundry in a different detergent. She sleeps on the right side and the rash is mostly on the right arm and right upper back area.  She does have 2 spots to abdomen.  No rash to hands, feet or in the beltline.  The rash is pruritic but denies any pain. Denies any fever, joint pain.  No recent travel. Nobody else at home has the rash. Patient has been outside but denies any contact with plants or insects.  She reports that she stripped her bending down and looked all other matches to ensure there was no bugs and did not see anything.  She has been using cortisone cream over-the-counter with no relief.        Rash    Past Medical History:  Diagnosis Date  . Hypertension     There are no active problems to display for this patient.   History reviewed. No pertinent surgical history.  OB History   None      Home Medications    Prior to Admission medications   Medication Sig Start Date End Date Taking? Authorizing Provider  amLODipine (NORVASC) 10 MG tablet Take 1 tablet (10 mg total) by mouth daily. Patient not taking: Reported on 11/29/2015 08/19/15   Emi Holes, PA-C  amLODipine (NORVASC) 10 MG tablet Take 1 tablet (10 mg total) by mouth daily. 06/13/17   Dartha Lodge, PA-C  cephALEXin (KEFLEX) 500 MG capsule Take 2 capsules (1,000 mg total) by mouth 2 (two) times daily. 10/30/16   Lawyer, Cristal Deer, PA-C  cetirizine (ZYRTEC) 10 MG tablet Take 1 tablet (10 mg total) by mouth daily. 12/29/17   Dahlia Byes A, NP  ibuprofen (ADVIL,MOTRIN) 800 MG tablet Take 1 tablet (800 mg total) by mouth every 8 (eight) hours as needed. 10/30/16   Lawyer, Cristal Deer, PA-C    predniSONE (DELTASONE) 20 MG tablet Take 3 tablets (60 mg total) by mouth daily. Patient not taking: Reported on 11/29/2015 08/19/15   Emi Holes, PA-C  promethazine (PHENERGAN) 25 MG tablet Take 1 tablet (25 mg total) by mouth every 8 (eight) hours as needed for nausea or vomiting. Patient not taking: Reported on 04/14/2015 04/14/15   Charlestine Night, PA-C  triamcinolone cream (KENALOG) 0.1 % Apply 1 application topically 2 (two) times daily. 12/29/17   Janace Aris, NP    Family History History reviewed. No pertinent family history.  Social History Social History   Tobacco Use  . Smoking status: Former Smoker    Packs/day: 0.00  . Smokeless tobacco: Never Used  Substance Use Topics  . Alcohol use: Yes    Alcohol/week: 1.0 standard drinks    Types: 1 Cans of beer per week  . Drug use: Yes    Types: Marijuana     Allergies   Patient has no known allergies.   Review of Systems Review of Systems  Skin: Positive for rash.     Physical Exam Triage Vital Signs ED Triage Vitals  Enc Vitals Group     BP 12/29/17 0857 (!) 153/88     Pulse Rate 12/29/17 0857 83  Resp 12/29/17 0857 17     Temp 12/29/17 0857 98.4 F (36.9 C)     Temp Source 12/29/17 0857 Oral     SpO2 12/29/17 0857 100 %     Weight --      Height --      Head Circumference --      Peak Flow --      Pain Score 12/29/17 0901 0     Pain Loc --      Pain Edu? --      Excl. in GC? --    No data found.  Updated Vital Signs BP (!) 153/88 (BP Location: Left Arm)   Pulse 83   Temp 98.4 F (36.9 C) (Oral)   Resp 17   LMP  (LMP Unknown)   SpO2 100%   Visual Acuity Right Eye Distance:   Left Eye Distance:   Bilateral Distance:    Right Eye Near:   Left Eye Near:    Bilateral Near:     Physical Exam  Constitutional: She is oriented to person, place, and time. She appears well-developed and well-nourished.  Very pleasant. Non toxic or ill appearing.   HENT:  Head: Normocephalic and  atraumatic.  Eyes: Conjunctivae are normal.  Neck: Normal range of motion.  Pulmonary/Chest: Effort normal.  Musculoskeletal: Normal range of motion.  Neurological: She is alert and oriented to person, place, and time.  Skin: Skin is warm and dry. Rash noted.  Maculopapular rash to right upper back and extending down around the right upper arm.  Areas of excoriation due to scratching.  No rash noted to flank, chest or ribs. She does have some slight swelling in the right hand and discoloration. He also has rash noted to stomach area   Psychiatric: She has a normal mood and affect.  Nursing note and vitals reviewed.    UC Treatments / Results  Labs (all labs ordered are listed, but only abnormal results are displayed) Labs Reviewed - No data to display  EKG None  Radiology No results found.  Procedures Procedures (including critical care time)  Medications Ordered in UC Medications - No data to display  Initial Impression / Assessment and Plan / UC Course  I have reviewed the triage vital signs and the nursing notes.  Pertinent labs & imaging results that were available during my care of the patient were reviewed by me and considered in my medical decision making (see chart for details).     Most likely contact dermatitis related to the new laundry detergent. No concern for shingles. We will try triamcinolone cream and Zyrtec or Benadryl as needed for itching. Follow up as needed for continued or worsening symptoms  Final Clinical Impressions(s) / UC Diagnoses   Final diagnoses:  Contact dermatitis, unspecified contact dermatitis type, unspecified trigger     Discharge Instructions     We will go ahead and treat you for contact dermatitis. Triamcinolone cream twice a day Zyrtec daily for itching Follow up as needed for continued or worsening symptoms     ED Prescriptions    Medication Sig Dispense Auth. Provider   cetirizine (ZYRTEC) 10 MG tablet Take 1  tablet (10 mg total) by mouth daily. 30 tablet Meriam Chojnowski A, NP   triamcinolone cream (KENALOG) 0.1 % Apply 1 application topically 2 (two) times daily. 30 g Dahlia Byes A, NP     Controlled Substance Prescriptions [place] Controlled Substance Registry consulted? Not Applicable   Janace Aris, NP 12/29/17  1011  

## 2017-12-29 NOTE — ED Triage Notes (Signed)
Pt presents with a rash all over she believes to be an allergic reaction to a laundry detergent.

## 2018-04-29 ENCOUNTER — Emergency Department (HOSPITAL_COMMUNITY)
Admission: EM | Admit: 2018-04-29 | Discharge: 2018-04-29 | Disposition: A | Discharge status: Home / Self Care | Payer: Self-pay | Attending: Emergency Medicine | Admitting: Emergency Medicine

## 2018-04-29 ENCOUNTER — Encounter (HOSPITAL_COMMUNITY): Payer: Self-pay

## 2018-04-29 ENCOUNTER — Emergency Department (HOSPITAL_COMMUNITY): Payer: Self-pay

## 2018-04-29 ENCOUNTER — Other Ambulatory Visit: Payer: Self-pay

## 2018-04-29 DIAGNOSIS — R0789 Other chest pain: Secondary | ICD-10-CM | POA: Insufficient documentation

## 2018-04-29 DIAGNOSIS — I10 Essential (primary) hypertension: Secondary | ICD-10-CM | POA: Insufficient documentation

## 2018-04-29 DIAGNOSIS — Z87891 Personal history of nicotine dependence: Secondary | ICD-10-CM | POA: Insufficient documentation

## 2018-04-29 DIAGNOSIS — Z79899 Other long term (current) drug therapy: Secondary | ICD-10-CM | POA: Insufficient documentation

## 2018-04-29 LAB — URINALYSIS, ROUTINE W REFLEX MICROSCOPIC
Bilirubin Urine: NEGATIVE
Glucose, UA: NEGATIVE mg/dL
Hgb urine dipstick: NEGATIVE
Ketones, ur: NEGATIVE mg/dL
Nitrite: NEGATIVE
Protein, ur: NEGATIVE mg/dL
Specific Gravity, Urine: 1.028 (ref 1.005–1.030)
pH: 5 (ref 5.0–8.0)

## 2018-04-29 LAB — BASIC METABOLIC PANEL
Anion gap: 13 (ref 5–15)
BUN: 11 mg/dL (ref 6–20)
CO2: 21 mmol/L — ABNORMAL LOW (ref 22–32)
Calcium: 9.1 mg/dL (ref 8.9–10.3)
Chloride: 104 mmol/L (ref 98–111)
Creatinine, Ser: 0.75 mg/dL (ref 0.44–1.00)
GFR calc Af Amer: >60 mL/min (ref >60)
GFR calc non Af Amer: >60 mL/min (ref >60)
Glucose, Bld: 116 mg/dL — ABNORMAL HIGH (ref 70–99)
Potassium: 3.8 mmol/L (ref 3.5–5.1)
Sodium: 138 mmol/L (ref 135–145)

## 2018-04-29 LAB — CBC WITH DIFFERENTIAL/PLATELET
Abs Immature Granulocytes: 0.01 10*3/uL (ref 0.00–0.07)
Basophils Absolute: 0.1 10*3/uL (ref 0.0–0.1)
Basophils Relative: 1 %
Eosinophils Absolute: 0.1 10*3/uL (ref 0.0–0.5)
Eosinophils Relative: 2 %
HCT: 40.2 % (ref 36.0–46.0)
Hemoglobin: 12.9 g/dL (ref 12.0–15.0)
Immature Granulocytes: 0 %
Lymphocytes Relative: 38 %
Lymphs Abs: 2.2 10*3/uL (ref 0.7–4.0)
MCH: 30.9 pg (ref 26.0–34.0)
MCHC: 32.1 g/dL (ref 30.0–36.0)
MCV: 96.2 fL (ref 80.0–100.0)
Monocytes Absolute: 0.6 10*3/uL (ref 0.1–1.0)
Monocytes Relative: 10 %
Neutro Abs: 2.8 10*3/uL (ref 1.7–7.7)
Neutrophils Relative %: 49 %
Platelets: 336 10*3/uL (ref 150–400)
RBC: 4.18 MIL/uL (ref 3.87–5.11)
RDW: 14 % (ref 11.5–15.5)
WBC: 5.7 10*3/uL (ref 4.0–10.5)
nRBC: 0 % (ref 0.0–0.2)

## 2018-04-29 LAB — I-STAT TROPONIN, ED
TROPONIN I, POC: 0 ng/mL (ref 0.00–0.08)
Troponin i, poc: 0 ng/mL (ref 0.00–0.08)

## 2018-04-29 MED ORDER — IBUPROFEN 800 MG PO TABS: 800.0000 mg | ORAL_TABLET | Freq: Three times a day (TID) | ORAL | 0 refills | Status: DC | PRN

## 2018-04-29 NOTE — ED Triage Notes (Signed)
Pt arrived to ER with c/o CP; pt states that after work yesterday she sat in chair and had pain in chest; pt contributes to a potassium abnormality has she has had electrolyte issues in past per pt

## 2018-04-29 NOTE — Discharge Instructions (Addendum)
Return here as needed.  Follow-up with a primary doctor for recheck.

## 2018-04-29 NOTE — ED Provider Notes (Signed)
[facility] Provider Note   CSN: [id number] Arrival date & time: 04/29/18  [id number]     History   Chief Complaint Chief Complaint  Patient presents with  . Chest Pain    HPI Tina Hensley is a 53 y.o. female.  HPI Patient presents to the emergency department with pain under her left breast that started yesterday.  Patient states that she has a very physical job and she is had this type of pain once in the past.  The patient states that nothing seems to make the condition better or worse.  Patient states she did not take any medications prior to arrival.  The patient denies shortness of breath, headache,blurred vision, neck pain, fever, cough, weakness, numbness, dizziness, anorexia, edema, abdominal pain, nausea, vomiting, diarrhea, rash, back pain, dysuria, hematemesis, bloody stool, near syncope, or syncope. Past Medical History:  Diagnosis Date  . Hypertension     There are no active problems to display for this patient.   History reviewed. No pertinent surgical history.   OB History   No obstetric history on file.      Home Medications    Prior to Admission medications   Medication Sig Start Date End Date Taking? Authorizing Provider  amLODipine (NORVASC) 10 MG tablet Take 1 tablet (10 mg total) by mouth daily. Patient not taking: Reported on 11/29/2015 08/19/15   Emi HolesLaw, Alexandra M, PA-C  amLODipine (NORVASC) 10 MG tablet Take 1 tablet (10 mg total) by mouth daily. 06/13/17   Dartha LodgeFord, Kelsey N, PA-C  cephALEXin (KEFLEX) 500 MG capsule Take 2 capsules (1,000 mg total) by mouth 2 (two) times daily. 10/30/16   Asser Lucena, Cristal Deerhristopher, PA-C  cetirizine (ZYRTEC) 10 MG tablet Take 1 tablet (10 mg total) by mouth daily. 12/29/17   Dahlia ByesBast, Traci A, NP  ibuprofen (ADVIL,MOTRIN) 800 MG tablet Take 1 tablet (800 mg total) by mouth every 8 (eight) hours as needed. 10/30/16   Takeisha Cianci, Cristal Deerhristopher, PA-C  predniSONE (DELTASONE) 20 MG tablet Take 3 tablets (60 mg  total) by mouth daily. Patient not taking: Reported on 11/29/2015 08/19/15   Emi HolesLaw, Alexandra M, PA-C  promethazine (PHENERGAN) 25 MG tablet Take 1 tablet (25 mg total) by mouth every 8 (eight) hours as needed for nausea or vomiting. Patient not taking: Reported on 04/14/2015 04/14/15   Charlestine NightLawyer, Marra Fraga, PA-C  triamcinolone cream (KENALOG) 0.1 % Apply 1 application topically 2 (two) times daily. 12/29/17   Janace ArisBast, Traci A, NP    Family History History reviewed. No pertinent family history.  Social History Social History   Tobacco Use  . Smoking status: Former Smoker    Packs/day: 0.00  . Smokeless tobacco: Never Used  Substance Use Topics  . Alcohol use: Yes    Alcohol/week: 1.0 standard drinks    Types: 1 Cans of beer per week  . Drug use: Yes    Types: Marijuana     Allergies   Patient has no known allergies.   Review of Systems Review of Systems All other systems negative except as documented in the HPI. All pertinent positives and negatives as reviewed in the HPI.  Physical Exam Updated Vital Signs BP (!) 162/93   Pulse 68   Temp 98 F (36.7 C) (Oral)   Resp 19   Ht 5\' 5"  (1.651 m)   Wt 68 kg   SpO2 100%   BMI 24.96 kg/m   Physical Exam Vitals signs and nursing note reviewed.  Constitutional:      General:  She is not in acute distress.    Appearance: She is well-developed.  HENT:     Head: Normocephalic and atraumatic.  Eyes:     Pupils: Pupils are equal, round, and reactive to light.  Neck:     Musculoskeletal: Normal range of motion and neck supple.  Cardiovascular:     Rate and Rhythm: Normal rate and regular rhythm.     Heart sounds: Normal heart sounds. No murmur. No friction rub. No gallop.   Pulmonary:     Effort: Pulmonary effort is normal. No respiratory distress.     Breath sounds: Normal breath sounds. No decreased breath sounds, wheezing, rhonchi or rales.  Chest:     Chest wall: Tenderness present.    Abdominal:     General: Bowel  sounds are normal. There is no distension.     Palpations: Abdomen is soft.     Tenderness: There is no abdominal tenderness.  Skin:    General: Skin is warm and dry.     Capillary Refill: Capillary refill takes less than 2 seconds.     Findings: No erythema or rash.  Neurological:     Mental Status: She is alert and oriented to person, place, and time.     Motor: No abnormal muscle tone.     Coordination: Coordination normal.  Psychiatric:        Behavior: Behavior normal.      ED Treatments / Results  Labs (all labs ordered are listed, but only abnormal results are displayed) Labs Reviewed  BASIC METABOLIC PANEL - Abnormal; Notable for the following components:      Result Value   CO2 21 (*)    Glucose, Bld 116 (*)    All other components within normal limits  URINALYSIS, ROUTINE W REFLEX MICROSCOPIC - Abnormal; Notable for the following components:   APPearance HAZY (*)    Leukocytes, UA TRACE (*)    Bacteria, UA FEW (*)    Non Squamous Epithelial 0-5 (*)    All other components within normal limits  CBC WITH DIFFERENTIAL/PLATELET  I-STAT TROPONIN, ED  I-STAT TROPONIN, ED    EKG EKG Interpretation  Date/Time:  Sunday April 29 2018 08:37:26 EST Ventricular Rate:  78 PR Interval:    QRS Duration: 79 QT Interval:  391 QTC Calculation: 446 R Axis:   65 Text Interpretation:  Sinus rhythm no acute ST/T changes no significant change since Mar 2018 Confirmed by Pricilla Loveless [phone]) on 04/29/2018 8:40:30 AM   Radiology Dg Chest 2 View  Result Date: 04/29/2018 CLINICAL DATA:  Pt arrived to ER with c/o CP; pt states that after work yesterday she sat in chair and had pain in chest; pt contributes to a potassium abnormality has she has had electrolyte issues in past per pt EXAM: CHEST - 2 VIEW COMPARISON:  06/02/2016 FINDINGS: The heart size and mediastinal contours are within normal limits. Both lungs are clear. The visualized skeletal structures are unremarkable.  IMPRESSION: No active cardiopulmonary disease. Electronically Signed   By: Norva Pavlov M.D.   On: 04/29/2018 12:02    Procedures Procedures (including critical care time)  Medications Ordered in ED Medications - No data to display   Initial Impression / Assessment and Plan / ED Course  I have reviewed the triage vital signs and the nursing notes.  Pertinent labs & imaging results that were available during my care of the patient were reviewed by me and considered in my medical decision making (see chart for details).  The patient's chest pain is most likely due to chest wall pain due to the fact that she has significant pain when I palpate the area just under her left breast.  This is most likely due to her strenuous job that she does every day.  Patient is advised to use heat over the area as well.  Told to return here for any worsening in her condition.  Final Clinical Impressions(s) / ED Diagnoses   Final diagnoses:  None    ED Discharge Orders    None       Charlestine NightLawyer, Tunya Held, PA-C 04/29/18 1437    Pricilla LovelessGoldston, Scott, MD 04/30/18 2350

## 2018-10-31 ENCOUNTER — Ambulatory Visit: Payer: Self-pay | Admitting: Family Medicine

## 2018-10-31 ENCOUNTER — Encounter: Payer: Self-pay | Admitting: Family Medicine

## 2018-10-31 ENCOUNTER — Ambulatory Visit: Payer: Self-pay | Attending: Family Medicine | Admitting: Family Medicine

## 2018-10-31 ENCOUNTER — Other Ambulatory Visit: Payer: Self-pay

## 2018-10-31 VITALS — BP 171/114 | HR 80 | Temp 98.3°F | Ht 65.0 in | Wt 156.8 lb

## 2018-10-31 DIAGNOSIS — I10 Essential (primary) hypertension: Secondary | ICD-10-CM | POA: Insufficient documentation

## 2018-10-31 MED ORDER — AMLODIPINE BESYLATE 10 MG PO TABS: 10.0000 mg | ORAL_TABLET | Freq: Every day | ORAL | 3 refills | Status: DC

## 2018-10-31 MED FILL — AMLODIPINE BESYLATE 10 MG T: 10 | 30 days supply | Qty: 30 | Fill #0

## 2018-10-31 NOTE — Progress Notes (Signed)
Hospital f/u and need medication refill. Per pt she have been out of her Blood Pressure medication since March 2020.

## 2018-10-31 NOTE — Progress Notes (Signed)
New Patient Office Visit  Subjective:  Patient ID: Tina Hensley, female    DOB: 02/18/1966  Age: 53 y.o. MRN: [id number]  CC:  Chief Complaint  Patient presents with  . New Patient (Initial Visit)  . Medication Refill    HPI Tina Hensley presents to establish care as a new patient as she has hypertension and was on amlodipine in the past but has been out of this medication for several months and her blood pressure has been untreated. She was seen in the ED back in February for left sided chest pain but was told that this was not related to her heart. She was prescribed ibuprofen for chest wall pain and the pain went away in about a week after her ED visit. She has had no further chest pain since that time. She has started having occasional dull headaches. She does feel that amlodipine controlled her blood pressure when she took it in the past.   Past Medical History:  Diagnosis Date  . Hypertension     Past Surgical History:  Procedure Laterality Date  . NO PAST SURGERIES      Family History  Problem Relation Age of Onset  . Hypertension Mother   . Diabetes Mother   . Hypertension Father   . Diabetes Father     Social History   Socioeconomic History  . Marital status: Married    Spouse name: Not on file  . Number of children: Not on file  . Years of education: Not on file  . Highest education level: Not on file  Occupational History  . Not on file  Social Needs  . Financial resource strain: Not on file  . Food insecurity    Worry: Not on file    Inability: Not on file  . Transportation needs    Medical: Not on file    Non-medical: Not on file  Tobacco Use  . Smoking status: Former Smoker    Packs/day: 0.00  . Smokeless tobacco: Never Used  Substance and Sexual Activity  . Alcohol use: Yes    Alcohol/week: 1.0 standard drinks    Types: 1 Cans of beer per week  . Drug use: Yes    Types: Marijuana  . Sexual activity: Not on file  Lifestyle  . Physical activity     Days per week: Not on file    Minutes per session: Not on file  . Stress: Not on file  Relationships  . Social Musicianconnections    Talks on phone: Not on file    Gets together: Not on file    Attends religious service: Not on file    Active member of club or organization: Not on file    Attends meetings of clubs or organizations: Not on file    Relationship status: Not on file  . Intimate partner violence    Fear of current or ex partner: Not on file    Emotionally abused: Not on file    Physically abused: Not on file    Forced sexual activity: Not on file  Other Topics Concern  . Not on file  Social History Narrative  . Not on file    ROS Review of Systems  Constitutional: Negative for chills, fatigue and fever.  HENT: Negative for sore throat and trouble swallowing.   Eyes: Negative for photophobia and visual disturbance.  Respiratory: Negative for cough and shortness of breath.   Cardiovascular: Negative for chest pain, palpitations and leg swelling.  Gastrointestinal: Negative  for abdominal pain, constipation, diarrhea and nausea.  Endocrine: Negative for cold intolerance, heat intolerance, polydipsia, polyphagia and polyuria.  Genitourinary: Negative for dysuria, flank pain and frequency.  Musculoskeletal: Negative for arthralgias and back pain.  Neurological: Positive for headaches. Negative for dizziness, weakness and numbness.  Hematological: Negative for adenopathy. Does not bruise/bleed easily.  Psychiatric/Behavioral: Negative for self-injury and sleep disturbance.    Objective:   Today's Vitals: BP (!) 171/114 (BP Location: Right Arm, Patient Position: Sitting, Cuff Size: Large)   Pulse 80   Temp 98.3 F (36.8 C) (Oral)   Ht 5\' 5"  (1.651 m)   Wt 156 lb 12.8 oz (71.1 kg)   SpO2 97%   BMI 26.09 kg/m   Physical Exam Constitutional:      Appearance: Normal appearance.  Neck:     Musculoskeletal: Normal range of motion and neck supple. No muscular  tenderness.     Vascular: No carotid bruit.  Cardiovascular:     Rate and Rhythm: Normal rate and regular rhythm.  Pulmonary:     Effort: Pulmonary effort is normal.     Breath sounds: Normal breath sounds.  Abdominal:     Palpations: Abdomen is soft.     Tenderness: There is no abdominal tenderness. There is no right CVA tenderness, left CVA tenderness, guarding or rebound.  Musculoskeletal:        General: No swelling or tenderness.     Right lower leg: No edema.     Left lower leg: No edema.  Lymphadenopathy:     Cervical: No cervical adenopathy.  Skin:    General: Skin is warm and dry.  Neurological:     General: No focal deficit present.     Mental Status: She is alert and oriented to person, place, and time.  Psychiatric:        Mood and Affect: Mood normal.        Behavior: Behavior normal.     Assessment & Plan:  1. Essential hypertension New prescription provided for amlodipine 10 mg once daily for patient to take daily for control of hypertension and she is advised to follow a low sodium/DASH type diet. Educational material on Hypertension provided as part of After Visit Summer-[facility].  Follow-up for nurse visit blood pressure recheck in about 2 weeks. Call or return sooner if any questions or concerns and go to the ED for acute severe headache, focal numbness or weakness, speech difficulty of any concerns.  - amLODipine (NORVASC) 10 MG tablet; Take 1 tablet (10 mg total) by mouth daily. To lower blood pressure  Dispense: 30 tablet; Refill: 3   Outpatient Encounter Medications as of 10/31/2018  Medication Sig  . cetirizine (ZYRTEC) 10 MG tablet Take 1 tablet (10 mg total) by mouth daily.  Marland Kitchen ibuprofen (ADVIL,MOTRIN) 800 MG tablet Take 1 tablet (800 mg total) by mouth every 8 (eight) hours as needed.  . triamcinolone cream (KENALOG) 0.1 % Apply 1 application topically 2 (two) times daily.  . [DISCONTINUED] amLODipine (NORVASC) 10 MG tablet Take 1 tablet (10 mg total) by  mouth daily.  Marland Kitchen amLODipine (NORVASC) 10 MG tablet Take 1 tablet (10 mg total) by mouth daily. To lower blood pressure  . cephALEXin (KEFLEX) 500 MG capsule Take 2 capsules (1,000 mg total) by mouth 2 (two) times daily. (Patient not taking: Reported on 10/31/2018)  . predniSONE (DELTASONE) 20 MG tablet Take 3 tablets (60 mg total) by mouth daily. (Patient not taking: Reported on 11/29/2015)  . promethazine (PHENERGAN) 25  MG tablet Take 1 tablet (25 mg total) by mouth every 8 (eight) hours as needed for nausea or vomiting. (Patient not taking: Reported on 04/14/2015)  . [DISCONTINUED] amLODipine (NORVASC) 10 MG tablet Take 1 tablet (10 mg total) by mouth daily. (Patient not taking: Reported on 11/29/2015)   No facility-administered encounter medications on file as of 10/31/2018.    An After Visit Summary was printed and given to the patient.  Follow-up: Return in about 6 weeks (around 12/12/2018) for HTN- 2 week nurse visit for BP check and 6 week office visit.  Tina Saupeammie Norlene Lanes, MD

## 2018-10-31 NOTE — Patient Instructions (Signed)

## 2018-11-14 ENCOUNTER — Ambulatory Visit: Payer: Self-pay | Attending: Family Medicine | Admitting: Pharmacist

## 2018-11-14 ENCOUNTER — Encounter: Payer: Self-pay | Admitting: Pharmacist

## 2018-11-14 ENCOUNTER — Other Ambulatory Visit: Payer: Self-pay

## 2018-11-14 VITALS — BP 136/91 | HR 80

## 2018-11-14 DIAGNOSIS — I10 Essential (primary) hypertension: Secondary | ICD-10-CM

## 2018-11-14 NOTE — Progress Notes (Signed)
   S:    PCP: Dr. Chapman Fitch  Patient arrives in good spirits. Presents to the clinic for BP check per request of PCP on 10/31/18. BP was elevated at that appointment but pt had been without medications prior.   Patient reports adherence with medications. Denies chest pain, dyspnea, HA or blurred vision.   Current BP Medications include:   - Amlodipine 10 mg.  - Took this morning; just resumed amlodipine ~2 wks ago per pt.    Antihypertensives tried in the past include: none; NKDA   Dietary habits include: reports giving up salt (pork); denies drinking caffeine  Exercise habits include: patient walks long distances daily Family / Social history:  - FHx: DM (mother, father); HTN (mother, father) - Alcohol: reports that she has stopped drinking since last appt - Tobacco: former smoker - quit smoking ~10 yrs ago  O:  L arm after 5 minutes rest: 136/91 , HR 80  Home BP readings: not taking   Last 3 Office BP readings: BP Readings from Last 3 Encounters:  11/14/18 (!) 136/91  10/31/18 (!) 171/114  04/29/18 (!) 149/94   BMET    Component Value Date/Time   NA 138 04/29/2018 0847   K 3.8 04/29/2018 0847   CL 104 04/29/2018 0847   CO2 21 (L) 04/29/2018 0847   GLUCOSE 116 (H) 04/29/2018 0847   BUN 11 04/29/2018 0847   CREATININE 0.75 04/29/2018 0847   CALCIUM 9.1 04/29/2018 0847   GFRNONAA >60 04/29/2018 0847   GFRAA >60 04/29/2018 0847   Renal function: CrCl cannot be calculated (Patient's most recent lab result is older than the maximum 21 days allowed.).  Clinical ASCVD: No  The ASCVD Risk score Mikey Bussing DC Jr., et al., 2013) failed to calculate for the following reasons:   Cannot find a previous HDL lab   Cannot find a previous total cholesterol lab  A/P: Hypertension longstanding currently above goal but improved on current medications. BP Goal = <130/80 mmHg. Patient is adherent with current medications. Encourage adherence to DASH diet and physical exercise. Will not make  any changes given her improvement. We did discuss that pt will likely need additional therapy to attain goal BP, but will defer to PCP as pt has a visit 12/12/18.  -Continued amlodipine 10 mg daily. -Counseled on lifestyle modifications for blood pressure control including reduced dietary sodium, increased exercise, adequate sleep  Results reviewed and written information provided. Total time in face-to-face counseling 15 minutes.   F/U Clinic Visit in 1 month with PCP.    Patient seen with: Dillard Essex PharmD Candidate  Class of 2022 [place], PharmD, [place] [phone]

## 2018-11-14 NOTE — Patient Instructions (Signed)
Thank you for coming to see us today.   Blood pressure today is improving.  Continue taking blood pressure medications as prescribed.   Limiting salt and caffeine, as well as exercising as able for at least 30 minutes for 5 days out of the week, can also help you lower your blood pressure.  Take your blood pressure at home if you are able. Please write down these numbers and bring them to your visits.  If you have any questions about medications, please call me [phone].  Luke  

## 2018-12-12 ENCOUNTER — Ambulatory Visit: Payer: Self-pay | Attending: Family Medicine | Admitting: Family Medicine

## 2018-12-12 ENCOUNTER — Encounter: Payer: Self-pay | Admitting: Family Medicine

## 2018-12-12 ENCOUNTER — Other Ambulatory Visit: Payer: Self-pay

## 2018-12-12 VITALS — BP 151/88 | HR 79 | Temp 98.4°F | Ht 65.0 in | Wt 152.0 lb

## 2018-12-12 DIAGNOSIS — R7309 Other abnormal glucose: Secondary | ICD-10-CM

## 2018-12-12 DIAGNOSIS — I10 Essential (primary) hypertension: Secondary | ICD-10-CM

## 2018-12-12 DIAGNOSIS — Z87898 Personal history of other specified conditions: Secondary | ICD-10-CM

## 2018-12-12 DIAGNOSIS — R739 Hyperglycemia, unspecified: Secondary | ICD-10-CM

## 2018-12-12 LAB — POCT GLYCOSYLATED HEMOGLOBIN (HGB A1C): Hemoglobin A1C: 5.5 % (ref 4.0–5.6)

## 2018-12-12 MED ORDER — LOSARTAN POTASSIUM-HCTZ 50-12.5 MG PO TABS: 1.0000 | ORAL_TABLET | Freq: Every day | ORAL | 3 refills | Status: DC

## 2018-12-12 MED FILL — LOSARTAN-HCTZ 50-12.5 MG TA: 50-12.5 | 30 days supply | Qty: 30 | Fill #0

## 2018-12-12 NOTE — Progress Notes (Signed)
Established Patient Office Visit  Subjective:  Patient ID: Tina Hensley, female    DOB: 31-Jul-1965  Age: 53 y.o. MRN: [id number]  CC: No chief complaint on file.   HPI Tina Hensley presents for follow-up of recent new patient visit on 10/31/2018 to establish care for uncontrolled hypertension.  New RX provided for amlodipine 10 mg which patient had been on in the past as her BP was 171/114 at her visit.  She reports compliance with amlodipine.  She denies any headaches or dizziness, no chest pain or palpitations, no shortness of breath or cough and no peripheral edema.  She also states that she found a new job since her last visit which has relieved some of her stress.  Past Medical History:  Diagnosis Date  . Hypertension     Past Surgical History:  Procedure Laterality Date  . NO PAST SURGERIES      Family History  Problem Relation Age of Onset  . Hypertension Mother   . Diabetes Mother   . Hypertension Father   . Diabetes Father     Social History   Socioeconomic History  . Marital status: Married    Spouse name: Not on file  . Number of children: Not on file  . Years of education: Not on file  . Highest education level: Not on file  Occupational History  . Not on file  Social Needs  . Financial resource strain: Not on file  . Food insecurity    Worry: Not on file    Inability: Not on file  . Transportation needs    Medical: Not on file    Non-medical: Not on file  Tobacco Use  . Smoking status: Former Smoker    Packs/day: 0.00  . Smokeless tobacco: Never Used  Substance and Sexual Activity  . Alcohol use: Yes    Alcohol/week: 1.0 standard drinks    Types: 1 Cans of beer per week  . Drug use: Yes    Types: Marijuana  . Sexual activity: Not on file  Lifestyle  . Physical activity    Days per week: Not on file    Minutes per session: Not on file  . Stress: Not on file  Relationships  . Social Musician on phone: Not on file    Gets together:  Not on file    Attends religious service: Not on file    Active member of club or organization: Not on file    Attends meetings of clubs or organizations: Not on file    Relationship status: Not on file  . Intimate partner violence    Fear of current or ex partner: Not on file    Emotionally abused: Not on file    Physically abused: Not on file    Forced sexual activity: Not on file  Other Topics Concern  . Not on file  Social History Narrative  . Not on file    Outpatient Medications Prior to Visit  Medication Sig Dispense Refill  . amLODipine (NORVASC) 10 MG tablet Take 1 tablet (10 mg total) by mouth daily. To lower blood pressure 30 tablet 3   No facility-administered medications prior to visit.     No Known Allergies  ROS Review of Systems  Constitutional: Negative for chills, fatigue and fever.  Eyes: Negative for photophobia and visual disturbance.  Respiratory: Negative for cough and shortness of breath.   Cardiovascular: Negative for chest pain, palpitations and leg swelling.  Gastrointestinal: Negative for  abdominal pain, constipation, diarrhea and nausea.  Endocrine: Negative for cold intolerance, heat intolerance, polydipsia, polyphagia and polyuria.  Genitourinary: Negative for dysuria and frequency.  Musculoskeletal: Negative for arthralgias and back pain.  Neurological: Negative for dizziness and headaches.  Hematological: Negative for adenopathy. Does not bruise/bleed easily.      Objective:    Physical Exam  Constitutional: She is oriented to person, place, and time. She appears well-developed and well-nourished.  Neck: Normal range of motion. Neck supple. No JVD present.  Cardiovascular: Normal rate and regular rhythm.  Pulmonary/Chest: Effort normal and breath sounds normal.  Abdominal: Soft. There is no abdominal tenderness. There is no rebound and no guarding.  Musculoskeletal:        General: No tenderness or edema.     Comments: No CVA  tenderness  Lymphadenopathy:    She has no cervical adenopathy.  Neurological: She is alert and oriented to person, place, and time.  Psychiatric: She has a normal mood and affect. Her behavior is normal.  Nursing note and vitals reviewed.   BP (!) 151/88 (BP Location: Left Arm, Patient Position: Sitting, Cuff Size: Normal)   Pulse 79   Temp 98.4 F (36.9 C) (Oral)   Ht 5\' 5"  (1.651 m)   Wt 152 lb (68.9 kg)   SpO2 100%   BMI 25.29 kg/m  Wt Readings from Last 3 Encounters:  10/31/18 156 lb 12.8 oz (71.1 kg)  04/29/18 150 lb (68 kg)  10/30/16 145 lb (65.8 kg)     Health Maintenance Due  Topic Date Due  . PNEUMOCOCCAL POLYSACCHARIDE VACCINE AGE 14-64 HIGH RISK  03/22/1966  . FOOT EXAM  03/22/1974  . OPHTHALMOLOGY EXAM  03/22/1974  . URINE MICROALBUMIN  03/22/1974  . HIV Screening  03/23/1979  . TETANUS/TDAP  03/23/1983  . PAP SMEAR-Modifier  03/22/1985  . MAMMOGRAM  03/22/2014  . COLONOSCOPY  03/22/2014  . HEMOGLOBIN A1C  10/12/2015  . INFLUENZA VACCINE  10/20/2018   Patient was again offered influenza immunization which she declined at today's visit   No results found for: TSH Lab Results  Component Value Date   WBC 5.7 04/29/2018   HGB 12.9 04/29/2018   HCT 40.2 04/29/2018   MCV 96.2 04/29/2018   PLT 336 04/29/2018   Lab Results  Component Value Date   NA 138 04/29/2018   K 3.8 04/29/2018   CO2 21 (L) 04/29/2018   GLUCOSE 116 (H) 04/29/2018   BUN 11 04/29/2018   CREATININE 0.75 04/29/2018   BILITOT 0.9 12/03/2015   ALKPHOS 54 12/03/2015   AST 23 12/03/2015   ALT 15 12/03/2015   PROT 7.7 12/03/2015   ALBUMIN 4.3 12/03/2015   CALCIUM 9.1 04/29/2018   ANIONGAP 13 04/29/2018   No results found for: CHOL No results found for: HDL No results found for: LDLCALC No results found for: TRIG No results found for: [facility] Lab Results  Component Value Date   HGBA1C 5.80 04/14/2015      Assessment & Plan:  1. Essential hypertension Patient's blood  pressure still above goal of 130/80 at today's visit.  We will add losartan hydrochlorothiazide 50-12.5 mg and she will continue the use of amlodipine.  Continue low-sodium diet and regular exercise.  Patient is to return in 2 weeks to have BMP done as well as recheck of blood pressure as a nurse visit.  If blood pressure is under 140/90 patient will follow-up in 4 months and as needed. - HgB J8H - Basic Metabolic Panel; Future -  losartan-hydrochlorothiazide (HYZAAR) 50-12.5 MG tablet; Take 1 tablet by mouth daily. To lower blood pressure  Dispense: 30 tablet; Refill: 3  2. Elevated random blood glucose level Patient with elevated random blood glucose level on blood work done in February of this year on review of chart and patient with history of prediabetes with hemoglobin A1c of 5.8 in 2017.  Hemoglobin A1c checked at today's visit and within normal at 5.5. - HgB A1c - Basic Metabolic Panel; Future  An After Visit Summary was printed and given to the patient.   Follow-up: Return in about 4 months (around 04/13/2019) for HTN- 2 weeks for lab/BP check ; 4 months if BP good at [place].    Cain Saupe, MD

## 2018-12-12 NOTE — Progress Notes (Signed)
F/u on blood pressure. Pt. Stated she took her BP medication this morning. Declined Flu shot.

## 2018-12-26 ENCOUNTER — Ambulatory Visit: Payer: Self-pay | Attending: Family Medicine | Admitting: Pharmacist

## 2018-12-26 ENCOUNTER — Other Ambulatory Visit: Payer: Self-pay

## 2018-12-26 DIAGNOSIS — I10 Essential (primary) hypertension: Secondary | ICD-10-CM

## 2018-12-26 MED ORDER — AMLODIPINE BESYLATE 10 MG PO TABS: 10.0000 mg | ORAL_TABLET | Freq: Every day | ORAL | 3 refills | Status: DC

## 2018-12-26 MED FILL — AMLODIPINE BESYLATE 10 MG T: 10 | 30 days supply | Qty: 30 | Fill #0

## 2018-12-26 NOTE — Progress Notes (Signed)
   S:    PCP: Dr. Chapman Fitch  Patient arrives in good spirits. Presents to the clinic for BP check per request of PCP on 12/12/18. BP was elevated at that appointment - Dr. Chapman Fitch left instructions to have patient follow-up in 4 months if BP was at goal.   Patient denies adherence with medications. She is taking her Hyzaar but ran out of amlodipine several days ago.   Denies chest pain, dyspnea, HA or blurred vision.   Current BP Medications include:   - Amlodipine 10 mg.  - Hyzaar 50-12.5 mg daily.   Antihypertensives tried in the past include: none; NKDA   Dietary habits include: reports giving up salt (pork); denies drinking caffeine  Exercise habits include: patient walks long distances daily Family / Social history:  - FHx: DM (mother, father); HTN (mother, father) - Alcohol: reports that she has stopped drinking since last appt - Tobacco: former smoker - quit smoking ~10 yrs ago  O:  L arm after 5 minutes rest: 152/94, HR 75  Home BP readings: not taking   Last 3 Office BP readings: BP Readings from Last 3 Encounters:  12/26/18 (!) 152/94  12/12/18 (!) 151/88  11/14/18 (!) 136/91   BMET    Component Value Date/Time   NA 138 04/29/2018 0847   K 3.8 04/29/2018 0847   CL 104 04/29/2018 0847   CO2 21 (L) 04/29/2018 0847   GLUCOSE 116 (H) 04/29/2018 0847   BUN 11 04/29/2018 0847   CREATININE 0.75 04/29/2018 0847   CALCIUM 9.1 04/29/2018 0847   GFRNONAA >60 04/29/2018 0847   GFRAA >60 04/29/2018 0847   Renal function: CrCl cannot be calculated (Patient's most recent lab result is older than the maximum 21 days allowed.).  Clinical ASCVD: No  The ASCVD Risk score Mikey Bussing DC Jr., et al., 2013) failed to calculate for the following reasons:   Cannot find a previous HDL lab   Cannot find a previous total cholesterol lab  A/P: Hypertension longstanding currently above goal. . BP Goal = <130/80 mmHg. Patient is not adherent with current medications. Refills provided for  amlodipine. Pt has been asked to return in 2 weeks for BP re-check.  -Continue Hyzaar 50-12.5 mg daily  -Restart amlodipine 10 mg daily. -Counseled on lifestyle modifications for blood pressure control including reduced dietary sodium, increased exercise, adequate sleep  Results reviewed and written information provided. Total time in face-to-face counseling 15 minutes.   F/U Clinic Visit in 2 weeks for recheck.   Benard Halsted, PharmD, [place] [phone]

## 2018-12-26 NOTE — Patient Instructions (Signed)
Thank you for coming to see [name] today.   Blood pressure today is elevated.   Continue taking blood pressure medications as prescribed. Restart the amlodipine.   Limiting salt and caffeine, as well as exercising as able for at least 30 minutes for 5 days out of the week, can also help you lower your blood pressure.  Take your blood pressure at home if you are able. Please write down these numbers and bring them to your visits.  If you have any questions about medications, please call me [phone].  Tina Hensley

## 2018-12-27 ENCOUNTER — Encounter: Payer: Self-pay | Admitting: Pharmacist

## 2019-01-10 ENCOUNTER — Other Ambulatory Visit: Payer: Self-pay

## 2019-01-10 ENCOUNTER — Ambulatory Visit: Payer: Self-pay | Attending: Family Medicine | Admitting: Pharmacist

## 2019-01-10 ENCOUNTER — Encounter: Payer: Self-pay | Admitting: Pharmacist

## 2019-01-10 VITALS — BP 136/83 | HR 72

## 2019-01-10 DIAGNOSIS — I10 Essential (primary) hypertension: Secondary | ICD-10-CM

## 2019-01-10 NOTE — Progress Notes (Signed)
   S:    PCP: Dr. Chapman Fitch  Patient arrives in good spirits. Presents to the clinic for BP check per request of PCP on 12/12/18. BP was elevated at that appointment - Dr. Chapman Fitch left instructions to have patient follow-up in 4 months if BP was at goal. I saw her on 10/7 and pt reported running out of amlodipine, so we had her follow-up today.  Patient reports adherence with medications.  Denies chest pain, dyspnea, HA or blurred vision.   Current BP Medications include:   - Amlodipine 10 mg.  - Hyzaar 50-12.5 mg daily.   Antihypertensives tried in the past include: none; NKDA   Dietary habits include: reports giving up salt (pork) - reports trying to increase more fruit; denies drinking caffeine  Exercise habits include: patient walks long distances daily Family / Social history:  - FHx: DM (mother, father); HTN (mother, father) - Alcohol: reports that she has stopped drinking since last appt -Tobacco: former smoker - quit smoking ~10 yrs ago  O:  L arm after 5 minutes rest: 136/83, HR 72  Home BP readings: not taking   Last 3 Office BP readings: BP Readings from Last 3 Encounters:  01/10/19 136/83  12/26/18 (!) 152/94  12/12/18 (!) 151/88   BMET    Component Value Date/Time   NA 138 04/29/2018 0847   K 3.8 04/29/2018 0847   CL 104 04/29/2018 0847   CO2 21 (L) 04/29/2018 0847   GLUCOSE 116 (H) 04/29/2018 0847   BUN 11 04/29/2018 0847   CREATININE 0.75 04/29/2018 0847   CALCIUM 9.1 04/29/2018 0847   GFRNONAA >60 04/29/2018 0847   GFRAA >60 04/29/2018 0847   Renal function: CrCl cannot be calculated (Patient's most recent lab result is older than the maximum 21 days allowed.).  Clinical ASCVD: No  The ASCVD Risk score Mikey Bussing DC Jr., et al., 2013) failed to calculate for the following reasons:   Cannot find a previous HDL lab   Cannot find a previous total cholesterol lab  A/P: Hypertension longstanding currently close to goal. BP Goal = <130/80 mmHg. Patient is adherent  with current medications. Continue current regimen and follow-up with PCP in January.  -Continue Hyzaar 50-12.5 mg daily  -Continue amlodipine 10 mg daily. -Counseled on lifestyle modifications for blood pressure control including reduced dietary sodium, increased exercise, adequate sleep  Results reviewed and written information provided. Total time in face-to-face counseling 15 minutes.   F/U PCP visit in January.   Benard Halsted, PharmD, [place] [phone]

## 2019-01-10 NOTE — Patient Instructions (Signed)
Thank you for coming to see us today.   Blood pressure today is improving.  Continue taking blood pressure medications as prescribed.   Limiting salt and caffeine, as well as exercising as able for at least 30 minutes for 5 days out of the week, can also help you lower your blood pressure.  Take your blood pressure at home if you are able. Please write down these numbers and bring them to your visits.  If you have any questions about medications, please call me [phone].  Tina Hensley  

## 2019-01-11 ENCOUNTER — Encounter (HOSPITAL_COMMUNITY): Payer: Self-pay

## 2019-01-11 ENCOUNTER — Other Ambulatory Visit: Payer: Self-pay

## 2019-01-11 ENCOUNTER — Emergency Department (HOSPITAL_COMMUNITY): Payer: Self-pay

## 2019-01-11 ENCOUNTER — Emergency Department (HOSPITAL_COMMUNITY)
Admission: EM | Admit: 2019-01-11 | Discharge: 2019-01-11 | Disposition: A | Discharge status: Home / Self Care | Payer: Self-pay | Attending: Emergency Medicine | Admitting: Emergency Medicine

## 2019-01-11 DIAGNOSIS — Z87891 Personal history of nicotine dependence: Secondary | ICD-10-CM | POA: Insufficient documentation

## 2019-01-11 DIAGNOSIS — I10 Essential (primary) hypertension: Secondary | ICD-10-CM | POA: Insufficient documentation

## 2019-01-11 DIAGNOSIS — G500 Trigeminal neuralgia: Secondary | ICD-10-CM | POA: Insufficient documentation

## 2019-01-11 LAB — BASIC METABOLIC PANEL WITH GFR
BUN/Creatinine Ratio: 16 (ref 9–23)
BUN: 12 mg/dL (ref 6–24)
CO2: 24 mmol/L (ref 20–29)
Calcium: 9.6 mg/dL (ref 8.7–10.2)
Chloride: 103 mmol/L (ref 96–106)
Creatinine, Ser: 0.75 mg/dL (ref 0.57–1.00)
GFR calc Af Amer: 105 mL/min/1.73
GFR calc non Af Amer: 91 mL/min/1.73
Glucose: 98 mg/dL (ref 65–99)
Potassium: 4.4 mmol/L (ref 3.5–5.2)
Sodium: 139 mmol/L (ref 134–144)

## 2019-01-11 LAB — BASIC METABOLIC PANEL
Anion gap: 11 (ref 5–15)
BUN: 11 mg/dL (ref 6–20)
CO2: 24 mmol/L (ref 22–32)
Calcium: 9.8 mg/dL (ref 8.9–10.3)
Chloride: 102 mmol/L (ref 98–111)
Creatinine, Ser: 0.64 mg/dL (ref 0.44–1.00)
GFR calc Af Amer: >60 mL/min (ref >60)
GFR calc non Af Amer: >60 mL/min (ref >60)
Glucose, Bld: 99 mg/dL (ref 70–99)
Potassium: 3.6 mmol/L (ref 3.5–5.1)
Sodium: 137 mmol/L (ref 135–145)

## 2019-01-11 LAB — LIPID PANEL
Chol/HDL Ratio: 2 ratio (ref 0.0–4.4)
Cholesterol, Total: 150 mg/dL (ref 100–199)
HDL: 74 mg/dL
LDL Chol Calc (NIH): 62 mg/dL (ref 0–99)
Triglycerides: 72 mg/dL (ref 0–149)
VLDL Cholesterol Cal: 14 mg/dL (ref 5–40)

## 2019-01-11 LAB — CBC WITH DIFFERENTIAL/PLATELET
Abs Immature Granulocytes: 0.01 10*3/uL (ref 0.00–0.07)
Basophils Absolute: 0 10*3/uL (ref 0.0–0.1)
Basophils Relative: 1 %
Eosinophils Absolute: 0.1 10*3/uL (ref 0.0–0.5)
Eosinophils Relative: 2 %
HCT: 40.8 % (ref 36.0–46.0)
Hemoglobin: 13.9 g/dL (ref 12.0–15.0)
Immature Granulocytes: 0 %
Lymphocytes Relative: 39 %
Lymphs Abs: 2.4 10*3/uL (ref 0.7–4.0)
MCH: 31.4 pg (ref 26.0–34.0)
MCHC: 34.1 g/dL (ref 30.0–36.0)
MCV: 92.1 fL (ref 80.0–100.0)
Monocytes Absolute: 0.6 10*3/uL (ref 0.1–1.0)
Monocytes Relative: 9 %
Neutro Abs: 3.1 10*3/uL (ref 1.7–7.7)
Neutrophils Relative %: 49 %
Platelets: 373 10*3/uL (ref 150–400)
RBC: 4.43 MIL/uL (ref 3.87–5.11)
RDW: 13.5 % (ref 11.5–15.5)
WBC: 6.2 10*3/uL (ref 4.0–10.5)
nRBC: 0 % (ref 0.0–0.2)

## 2019-01-11 MED ORDER — MORPHINE SULFATE (PF) 4 MG/ML IV SOLN
4.0000 mg | Freq: Once | INTRAVENOUS | Status: AC
  Administered 2019-01-11: 4 mg via INTRAVENOUS
  Filled 2019-01-11: qty 1

## 2019-01-11 MED ORDER — CARBAMAZEPINE 200 MG PO TABS: 100.0000 mg | ORAL_TABLET | Freq: Two times a day (BID) | ORAL | 0 refills | Status: DC

## 2019-01-11 NOTE — ED Triage Notes (Signed)
Pt arrives POV for eval of headache and facial "burning". Pt reports HA onset 2 weeks prior, seen at [facility] yesterday. Reports pain radiates from posterior head into face "feels like someone is tapping on a nerve". Pt is tearful and uncomfortable in triage

## 2019-01-11 NOTE — ED Notes (Signed)
Pt stated that since morphine administration she has not had any headache

## 2019-01-11 NOTE — Discharge Instructions (Signed)
You were seen in the emergency department for headache.  I suspect that you have inflammation of the nerve that is causing your symptoms.  I am starting a medication for headaches that may need to be increased over time.  It is important that you are followed both by your primary care doctor and neurologist while taking this medication.  If you develop severe nauseous, drowsiness, vision changes, or worsening headache you should return to the emergency department.  I have placed a referral to the neurologist.  Please call on Monday to arrange the follow-up appointment but they should be calling you as well.

## 2019-01-11 NOTE — ED Notes (Signed)
Pt verbalized understanding of d/c instructions, scripts, followup care and s/s requiring return to ED. Pt had no additional questions at this time.

## 2019-01-11 NOTE — ED Provider Notes (Signed)
Emergency Department Provider Note   I have reviewed the triage vital signs and the nursing notes.   HISTORY  Chief Complaint Headache   HPI Tina Hensley is a 53 y.o. female with PMH of HTN presents the emergency department with sharp, electrical type pain shooting from the right side of the posterior scalp around to the face.  Patient has had 2 weeks of intermittent symptoms.  She states that the pain begins suddenly and without clear provocation.  No modifying factors.  No vision changes.  She denies any trauma, fevers, cough.  She has been taking her blood pressure medication and following with her primary care doctor.  She denies any numbness or weakness symptoms.  She notes a possible history of similar headache but states it has been at least 10 years ago and she does not have a clear memory of that episode. Denies neck pain. No rash.    Past Medical History:  Diagnosis Date  . Hypertension     Patient Active Problem List   Diagnosis Date Noted  . Essential hypertension 10/31/2018    Past Surgical History:  Procedure Laterality Date  . NO PAST SURGERIES      Allergies Patient has no known allergies.  Family History  Problem Relation Age of Onset  . Hypertension Mother   . Diabetes Mother   . Hypertension Father   . Diabetes Father     Social History Social History   Tobacco Use  . Smoking status: Former Smoker    Packs/day: 0.00  . Smokeless tobacco: Never Used  Substance Use Topics  . Alcohol use: Yes    Alcohol/week: 1.0 standard drinks    Types: 1 Cans of beer per week  . Drug use: Yes    Types: Marijuana    Review of Systems  Constitutional: No fever/chills Eyes: No visual changes. ENT: No sore throat. Cardiovascular: Denies chest pain. Respiratory: Denies shortness of breath. Gastrointestinal: No abdominal pain.  No nausea, no vomiting.  No diarrhea.  No constipation. Genitourinary: Negative for dysuria. Musculoskeletal: Negative for back  pain. Skin: Negative for rash. Neurological: Negative for focal weakness or numbness. Positive HA.   10-point ROS otherwise negative.  ____________________________________________   PHYSICAL EXAM:  VITAL SIGNS: ED Triage Vitals  Enc Vitals Group     BP 01/11/19 1332 138/83     Pulse Rate 01/11/19 1332 84     Resp 01/11/19 1332 17     Temp 01/11/19 1332 98.5 F (36.9 C)     Temp Source 01/11/19 1332 Oral     SpO2 01/11/19 1332 99 %     Weight 01/11/19 1336 152 lb (68.9 kg)     Height 01/11/19 1336 5\' 5"  (1.651 m)   Constitutional: Alert and oriented. Well appearing and in no acute distress. Eyes: Conjunctivae are normal. PERRL. Head: Atraumatic. Nose: No congestion/rhinnorhea. Mouth/Throat: Mucous membranes are moist.  Neck: No stridor.  Cardiovascular: Normal rate, regular rhythm. Good peripheral circulation. Grossly normal heart sounds.   Respiratory: Normal respiratory effort.  No retractions. Lungs CTAB. Gastrointestinal: Soft and nontender. No distention.  Musculoskeletal: No lower extremity tenderness nor edema. No gross deformities of extremities. Neurologic:  Normal speech and language. Normal CN exam 2-12. Normal grip strength and no pronator drift.  Skin:  Skin is warm, dry and intact. No rash noted.  ____________________________________________   LABS (all labs ordered are listed, but only abnormal results are displayed)  Labs Reviewed  BASIC METABOLIC PANEL  CBC WITH DIFFERENTIAL/PLATELET  ____________________________________________  RADIOLOGY  Ct Head Wo Contrast  Result Date: 01/11/2019 CLINICAL DATA:  Headache, facial pain and burning EXAM: CT HEAD WITHOUT CONTRAST TECHNIQUE: Contiguous axial images were obtained from the base of the skull through the vertex without intravenous contrast. COMPARISON:  11/19/2007 FINDINGS: Brain: No evidence of acute infarction, hemorrhage, hydrocephalus, extra-axial collection or mass lesion/mass effect. Vascular:  No hyperdense vessel or unexpected calcification. Skull: Normal. Negative for fracture or focal lesion. Sinuses/Orbits: Total opacification of the right maxillary sinus with bony thickening of the sinus walls. Other: None. IMPRESSION: 1. No acute intracranial pathology. No non-contrast intracranial findings to explain headache. 2. Total opacification of the right maxillary sinus with bony thickening of the sinus walls, generally consistent with chronic sinusitis. Correlate with clinical symptoms. Electronically Signed   By: Eddie Candle M.D.   On: 01/11/2019 21:41    ____________________________________________   PROCEDURES  Procedure(s) performed:   Procedures  None  ____________________________________________   INITIAL IMPRESSION / ASSESSMENT AND PLAN / ED COURSE  Pertinent labs & imaging results that were available during my care of the patient were reviewed by me and considered in my medical decision making (see chart for details).   Patient presents the emergency department for evaluation of burning/sharp pain radiating from her right posterior scalp to her face.  I do not appreciate a zoster type rash.  The pain symptoms do cross dermatomes of lower suspicion for this clinically.  Patient without temporal artery tenderness or vision change.  No sudden onset, maximal intensity headache symptoms to suspect aneurysm.  Patient is concerned because she states she does have a family history and so with new onset/worsening headaches I do plan for CT imaging of the head along with screening lab work. Possible trigeminal neuralgia.   CT head negative for acute process. Chronic sinusitis noted but nothing acute. Chemistry and CBC interpreted. Clinically HA character is typical of trigeminal neuralgia. Will refer to Neuro and start on low-dose Carbamezapine with plan for PCP or specialist to titrate up.   ____________________________________________  FINAL CLINICAL IMPRESSION(S) / ED DIAGNOSES   Final diagnoses:  Trigeminal neuralgia of right side of face     MEDICATIONS GIVEN DURING THIS VISIT:  Medications  morphine 4 MG/ML injection 4 mg (4 mg Intravenous Given 01/11/19 2151)     NEW OUTPATIENT MEDICATIONS STARTED DURING THIS VISIT:  Discharge Medication List as of 01/11/2019 11:05 PM    START taking these medications   Details  carbamazepine (TEGRETOL) 200 MG tablet Take 0.5 tablets (100 mg total) by mouth 2 (two) times daily for 21 days., Starting Fri 01/11/2019, Until Fri 02/01/2019, Print        Note:  This document was prepared using Dragon voice recognition software and may include unintentional dictation errors.  Nanda Quinton, MD, [facility]    Kemo Spruce, [facility], MD 01/12/19 [phone]

## 2019-01-11 NOTE — ED Notes (Signed)
Patient transported to CT 

## 2019-01-15 ENCOUNTER — Ambulatory Visit: Payer: Self-pay | Admitting: Neurology

## 2019-01-15 ENCOUNTER — Encounter: Payer: Self-pay | Admitting: Neurology

## 2019-01-15 ENCOUNTER — Other Ambulatory Visit: Payer: Self-pay

## 2019-01-15 DIAGNOSIS — G500 Trigeminal neuralgia: Secondary | ICD-10-CM | POA: Insufficient documentation

## 2019-01-15 MED ORDER — CARBAMAZEPINE 200 MG PO TABS: 200.0000 mg | ORAL_TABLET | Freq: Two times a day (BID) | ORAL | 1 refills | Status: DC

## 2019-01-15 MED ORDER — GUAIFENESIN ER 600 MG PO TB12: 1200.0000 mg | ORAL_TABLET | Freq: Two times a day (BID) | ORAL | Status: DC

## 2019-01-15 NOTE — Progress Notes (Signed)
SLEEP MEDICINE CLINIC    Provider:  Melvyn Novas, MD  Primary Care Physician:  Cain Saupe, MD [address] [place] [address]     Referring Provider:   Maia Plan, Md [address] [place],  [place] [address]          Chief Complaint according to patient   Patient presents with:     New Patient (Initial Visit)           HISTORY OF PRESENT ILLNESS:  Tina Hensley is a 53 y.o. year old Black or [place] American female patient seen here upon an ED referral on 01/15/2019  for a sudden headache.  Chief concern according to patient : " I felt as if someone slapped my face- every 10-15 minutes "    I have the pleasure of seeing Tina Hensley today, a right -handed Black or [place] American female with a possible sleep disorder.  She  has a past medical history of Hypertension.  The patient presented to the emergency room on 11 January 2019.  She was seen by Dr. Ivin Booty long.  She describes sharp electric shock type pain shooting from the right side of the posterior scalp around the face to the mid face.  She had 2 weeks of intermittent symptoms but the increasing frequency of headaches led to her presentation in the emergency room.  The pain began suddenly without any warning and she had no trauma no fever and no medication changes that could have contributed.  There was no numbness and no weakness.  There is no hearing loss and no abnormal vision.  Dr. Jacqulyn Bath diagnosed trigeminal neuralgia.  He also elicited her social history the patient is a former smoker she does not use any tobacco in any form now, not vaping no Nicotrol patches.  She drinks maybe 1 standard drink of alcohol a week, she has been smoking occasionally marijuana.  She had remarkable normal vital signs and a CT of the head was obtained no acute intracranial pathology was noted.  The right sinus however seem to have mucous retention- a sign of chronic sinusitis.  A basic metabolic panel and a complete blood cell  count were normal she was started on Tegretol 200 mg tablets and was asked to take a half in the morning and 2 times daily for 21 days.  She presents here 4 days after her emergency room visit.  She had described the pain as a facial burning.  Sometimes it felt like somebody was tapping onto the nerve.      Social history: Patient is working as Advertising copywriter and lives in a household with  4 persons/ alone.  Family status is partner and children,   The patient currently works in daytime . Pets are not present. the patient is a former smoker she does not use any tobacco in any form now, not vaping no Nicotrol patches.  She drinks maybe 1 standard drink of alcohol a week, she has been smoking occasionally marijuana.  No caffeine.    Sleep habits are as follows: The patient's dinner time is between 6. 6.30  PM.  Chewing has not triggered the pain.  The patient goes to bed at 10.30 PM and continues to sleep for 5-6 hours. Now , her sleep is fragmented due to facial pain. The preferred sleep position is undetermined , with the support of 1 pillow. Dreams are reportedly rare/ 4.30  AM is the usual rise time. She reports not  feeling refreshed or restored in AM, with symptoms such as dry mouth, morning headaches, and residual fatigue.    Review of Systems: Out of a complete 14 system review, the patient complains of only the following symptoms, and all other reviewed systems are negative.:  Fatigue, sleepiness , snoring, fragmented sleep, Insomnia    How likely are you to doze in the following situations: 0 = not likely, 1 = slight chance, 2 = moderate chance, 3 = high chance   Sitting and Reading? Watching Television? Sitting inactive in a public place (theater or meeting)? As a passenger in a car for an hour without a break? Lying down in the afternoon when circumstances permit? Sitting and talking to someone? Sitting quietly after lunch without alcohol? In a car, while stopped for a few minutes in  traffic?   Total = N?A / 24 points   Social History   Socioeconomic History   Marital status: Married    Spouse name: Not on file   Number of children: Not on file   Years of education: Not on file   Highest education level: Not on file  Occupational History   Not on file  Social Needs   Financial resource strain: Not on file   Food insecurity    Worry: Not on file    Inability: Not on file   Transportation needs    Medical: Not on file    Non-medical: Not on file  Tobacco Use   Smoking status: Former Smoker    Packs/day: 0.00   Smokeless tobacco: Never Used  Substance and Sexual Activity   Alcohol use: Yes    Alcohol/week: 1.0 standard drinks    Types: 1 Cans of beer per week   Drug use: Not Currently    Types: Marijuana   Sexual activity: Not on file  Lifestyle   Physical activity    Days per week: Not on file    Minutes per session: Not on file   Stress: Not on file  Relationships   Social connections    Talks on phone: Not on file    Gets together: Not on file    Attends religious service: Not on file    Active member of club or organization: Not on file    Attends meetings of clubs or organizations: Not on file    Relationship status: Not on file  Other Topics Concern   Not on file  Social History Narrative   Not on file    Family History  Problem Relation Age of Onset   Hypertension Mother    Diabetes Mother    Hypertension Father    Diabetes Father     Past Medical History:  Diagnosis Date   Hypertension     Past Surgical History:  Procedure Laterality Date   NO PAST SURGERIES       Current Outpatient Medications on File Prior to Visit  Medication Sig Dispense Refill   amLODipine (NORVASC) 10 MG tablet Take 1 tablet (10 mg total) by mouth daily. To lower blood pressure 30 tablet 3   carbamazepine (TEGRETOL) 200 MG tablet Take 0.5 tablets (100 mg total) by mouth 2 (two) times daily for 21 days. 21 tablet 0    losartan-hydrochlorothiazide (HYZAAR) 50-12.5 MG tablet Take 1 tablet by mouth daily. To lower blood pressure 30 tablet 3   No current facility-administered medications on file prior to visit.     No Known Allergies  Physical exam:  Today's Vitals   01/15/19  1507  BP: 123/82  Pulse: 79  Temp: 97.8 F (36.6 C)  Weight: 150 lb (68 kg)  Height: 5\' 5"  (1.651 m)   Body mass index is 24.96 kg/m.   Wt Readings from Last 3 Encounters:  01/15/19 150 lb (68 kg)  01/11/19 152 lb (68.9 kg)  12/12/18 152 lb (68.9 kg)     Ht Readings from Last 3 Encounters:  01/15/19 5\' 5"  (1.651 m)  01/11/19 5\' 5"  (1.651 m)  12/12/18 5\' 5"  (1.651 m)      General: The patient is awake, alert and appears not in acute distress. The patient is well groomed. Head: Normocephalic, atraumatic. Neck is supple.   Mallampati 3-4 ,  neck circumference:13. 75 inches . Nasal airflow  patent.  Retrognathia is mildy seen.  Dental status: biological  Cardiovascular:  Regular rate and cardiac rhythm by pulse,  without distended neck veins. Respiratory: Lungs are clear to auscultation.  Skin:  Without evidence of ankle edema, or rash. Trunk: The patient's posture is erect.   Neurologic exam : The patient is awake and alert, oriented to place and time.   Memory subjective described as intact.  Attention span & concentration ability appears normal.  Speech is fluent,  without  dysarthria, dysphonia or aphasia.  Mood and affect are appropriate.   Cranial nerves: no loss of smell or taste reported  Pupils are equal and briskly reactive to light. Funduscopic exam deferred.   Extraocular movements in vertical and horizontal planes were intact and without nystagmus. No Diplopia. Visual fields by finger perimetry are intact. Hearing was intact to soft voice and finger rubbing.    Facial sensation to fine touch is abnormal, painful area over the right cheekbone is numb.hyperasthesia. .  Facial motor strength is  symmetric and tongue and uvula move midline.  Neck ROM : rotation, tilt and flexion extension were normal for age and shoulder shrug was symmetrical.    Motor exam:  Symmetric bulk, tone and ROM.   Normal tone without cog wheeling, symmetric grip strength .   Sensory:  Fine touch, pinprick and vibration were tested  and  normal.  Proprioception tested in the upper extremities was normal.   Coordination: Rapid alternating movements in the fingers/hands were of normal speed.  The Finger-to-nose maneuver was intact without evidence of ataxia, dysmetria or tremor.   Gait and station: Patient could rise unassisted from a seated position, walked without assistive device.  Stance is of normal width/ base and the patient turned with 3 steps.  Toe and heel walk were deferred.  Deep tendon reflexes: in the  upper and lower extremities are symmetric and intact.  Babinski response was deferred.       After spending a total time of 40 minutes face to face and additional time for physical and neurologic examination, review of laboratory studies,  personal review of imaging studies, reports and results of other testing and review of referral information / records as far as provided in visit, I have established the following assessments:  1) trigeminal neuralgia of the right middle branch causing shooting and burning facial pain and intermittent numbness between painful paroxysmal spells.  The patient had a normal blood cell count and a normal metabolic panel her CT of the head was normal and she is not febrile and has no indication of risk factors for a Covid infection.  The headaches have been rather sudden in onset and have repeated to affect her some days multiple times a day some  days even affecting her sleep and her inability to sleep on the right side of her face.  She does have a right maxillary sinusitis and this should be treated by primary care.  I will give her Mucinex for that hopefully she can  get rid of some of the mucus retention I also will continue to prescribe for carbamazepine she has currently taken 100 mg twice a day which is equivalent to one of the tablets broken and how twice a day.  We will go up to 200 mg twice a day full tablets after 1 week I will need to repeat a sodium level within the next 4 to 6 weeks to make sure that she does not develop hyponatremia or lower CBC.  She has no history of zoster, herpetic infections, or trauma.  Usually Tegretol in a upward titration will treat trigeminal neuralgia within 14 days.  However it has to be maintained for several weeks before we can think about weaning it again.  Many of my patients will stay on it for a long time.      My Plan is to proceed with: 1) Tegretol 200 mg bid po. MRI brain with MRA if trigeminal neuralgia is not controlled by 4 weeks on full dose.    I would like to thank Antony Blackbird, MD and Long, Wonda Olds, Md [address] [place],  [place] [address] for allowing me to meet with and to take care of this pleasant patient.   In short, Tina Hensley is presenting with right sided TG Neuralgia as a self pay patient   I plan to follow up  through our NP within 2 month.   CC: I will share my notes with PCP>   Electronically signed by: Larey Seat, MD 01/15/2019 3:29 PM  [facility] and [facility] Sleep Board certified by The [employer] of Sleep Medicine and Diplomate of the [employer] of Sleep Medicine. Board certified In Neurology through the [place], Fellow of the [employer] of Neurology. Medical Director of [employer].

## 2019-01-15 NOTE — Patient Instructions (Addendum)
Start mucinex and drink hot liquids, broth, hot tes, herbal teas , etc.  gola is to liquify the mucous retention in your right maxillary sinus.   Tegretol 200 mg bid for the next 4-6 weeks, call us if symptoms have improved.  RV with PCP or NP.  Trigeminal Neuralgia  Trigeminal neuralgia is a nerve disorder that causes severe pain on one side of the face. The pain may last from a few seconds to several minutes. The pain is usually only on one side of the face. Symptoms may occur for days, weeks, or months and then go away for months or years. The pain may return and be worse than before. What are the causes? This condition is caused by damage or pressure to a nerve in the head that is called the trigeminal nerve. An attack can be triggered by:  Talking.  Chewing.  Putting on makeup.  Washing your face.  Shaving your face.  Brushing your teeth.  Touching your face. What increases the risk? You are more likely to develop this condition if you:  Are 54 years of age or older.  Are female. What are the signs or symptoms? The main symptom of this condition is severe pain in the:  Jaw.  Lips.  Eyes.  Nose.  Scalp.  Forehead.  Face. The pain may be:  Intense.  Stabbing.  Electric.  Shock-like. How is this diagnosed? This condition is diagnosed with a physical exam. A CT scan or an MRI may be done to rule out other conditions that can cause facial pain. How is this treated? This condition may be treated with:  Avoiding the things that trigger your symptoms.  Taking prescription medicines (anticonvulsants).  Having surgery. This may be done in severe cases if other medical treatment does not provide relief.  Having procedures such as ablation, thermal, or radiation therapy. It may take up to one month for treatment to start relieving the pain. Follow these instructions at home: Managing pain  Learn as much as you can about how to manage your pain. Ask your  health care provider if a pain specialist would be helpful.  Consider talking with a mental health care provider (psychologist) about how to cope with the pain.  Consider joining a pain support group. General instructions  Take over-the-counter and prescription medicines only as told by your health care provider.  Avoid the things that trigger your symptoms. It may help to: ? Chew on the unaffected side of your mouth. ? Avoid touching your face. ? Avoid blasts of hot or cold air.  Follow your treatment plan as told by your health care provider. This may include: ? Cognitive or behavioral therapy. ? Gentle, regular exercise. ? Meditation or yoga. ? Aromatherapy.  Keep all follow-up visits as told by your health care provider. You may need to be monitored closely to make sure treatment is working well for you. Where to find more information  Facial Pain Association: fpa-support.org Contact a health care provider if:  Your medicine is not helping your symptoms.  You have side effects from the medicine used for treatment.  You develop new, unexplained symptoms, such as: ? Double vision. ? Facial weakness. ? Facial numbness. ? Changes in hearing or balance.  You feel depressed. Get help right away if:  Your pain is severe and is not getting better.  You develop suicidal thoughts. If you ever feel like you may hurt yourself or others, or have thoughts about taking your own life,  get help right away. You can go to your nearest emergency department or call:  Your local emergency services (911 in the [place]  A suicide crisis helpline, such as the [place] at (450)764-2432. This is open 24 hours a day. Summary  Trigeminal neuralgia is a nerve disorder that causes severe pain on one side of the face. The pain may last from a few seconds to several minutes.  This condition is caused by damage or pressure to a nerve in the head that is called the  trigeminal nerve.  Treatment may include avoiding the things that trigger your symptoms, taking medicines, or having surgery or procedures. It may take up to one month for treatment to start relieving the pain.  Avoid the things that trigger your symptoms.  Keep all follow-up visits as told by your health care provider. You may need to be monitored closely to make sure treatment is working well for you. This information is not intended to replace advice given to you by your health care provider. Make sure you discuss any questions you have with your health care provider. Document Released: 03/04/2000 Document Revised: 01/22/2018 Document Reviewed: 01/22/2018 Elsevier Patient Education  2020 [employer].

## 2019-01-16 MED FILL — LOSARTAN-HCTZ 50-12.5 MG TA: 50-12.5 | 30 days supply | Qty: 30 | Fill #1

## 2019-01-16 MED FILL — carBAMazepine 200 MG TABS: 200 | 30 days supply | Qty: 60 | Fill #0

## 2019-04-01 ENCOUNTER — Encounter: Payer: Self-pay | Admitting: Family Medicine

## 2019-04-01 ENCOUNTER — Telehealth: Payer: Self-pay

## 2019-04-01 ENCOUNTER — Ambulatory Visit: Payer: Self-pay | Admitting: Family Medicine

## 2019-04-01 NOTE — Telephone Encounter (Signed)
Patient was a no call/no show for their appointment today.   

## 2019-04-15 ENCOUNTER — Ambulatory Visit: Payer: Self-pay | Admitting: Family Medicine

## 2019-04-16 MED FILL — carBAMazepine 200 MG TABS: 200 | 30 days supply | Qty: 60 | Fill #1

## 2019-04-16 MED FILL — LOSARTAN-HCTZ 50-12.5 MG TA: 50-12.5 | 30 days supply | Qty: 30 | Fill #2

## 2019-04-16 MED FILL — AMLODIPINE BESYLATE 10 MG T: 10 | 30 days supply | Qty: 30 | Fill #1

## 2019-04-17 ENCOUNTER — Other Ambulatory Visit: Payer: Self-pay

## 2019-04-17 ENCOUNTER — Encounter: Payer: Self-pay | Admitting: Family Medicine

## 2019-04-17 ENCOUNTER — Ambulatory Visit: Payer: Self-pay | Attending: Family Medicine | Admitting: Family Medicine

## 2019-04-17 VITALS — BP 130/85 | HR 86 | Temp 96.1°F | Ht 65.0 in | Wt 150.4 lb

## 2019-04-17 DIAGNOSIS — Z1211 Encounter for screening for malignant neoplasm of colon: Secondary | ICD-10-CM

## 2019-04-17 DIAGNOSIS — Z87898 Personal history of other specified conditions: Secondary | ICD-10-CM

## 2019-04-17 DIAGNOSIS — R7303 Prediabetes: Secondary | ICD-10-CM

## 2019-04-17 DIAGNOSIS — I10 Essential (primary) hypertension: Secondary | ICD-10-CM

## 2019-04-17 DIAGNOSIS — Z1231 Encounter for screening mammogram for malignant neoplasm of breast: Secondary | ICD-10-CM

## 2019-04-17 DIAGNOSIS — G500 Trigeminal neuralgia: Secondary | ICD-10-CM

## 2019-04-17 DIAGNOSIS — Z79899 Other long term (current) drug therapy: Secondary | ICD-10-CM

## 2019-04-17 LAB — POCT GLYCOSYLATED HEMOGLOBIN (HGB A1C): Hemoglobin A1C: 5.9 % — AB (ref 4.0–5.6)

## 2019-04-17 LAB — GLUCOSE, POCT (MANUAL RESULT ENTRY): POC Glucose: 133 mg/dL — AB (ref 70–99)

## 2019-04-17 MED ORDER — LOSARTAN POTASSIUM-HCTZ 50-12.5 MG PO TABS: 1.0000 | ORAL_TABLET | Freq: Every day | ORAL | 1 refills | Status: DC

## 2019-04-17 MED ORDER — AMLODIPINE BESYLATE 10 MG PO TABS: 10.0000 mg | ORAL_TABLET | Freq: Every day | ORAL | 1 refills | Status: DC

## 2019-04-17 MED ORDER — CARBAMAZEPINE 200 MG PO TABS: 200.0000 mg | ORAL_TABLET | Freq: Two times a day (BID) | ORAL | 2 refills | Status: DC

## 2019-04-17 MED ORDER — METFORMIN HCL 500 MG PO TABS: 500.0000 mg | ORAL_TABLET | Freq: Two times a day (BID) | ORAL | 3 refills | Status: DC

## 2019-04-17 MED FILL — metFORMIN HCL 500 MG TABS: 500 | 30 days supply | Qty: 60 | Fill #0

## 2019-04-17 NOTE — Progress Notes (Signed)
Established Patient Office Visit  Subjective:  Patient ID: Tina Hensley, female    DOB: [birth year]  Age: 54 y.o. MRN: [id number]  CC:  Chief Complaint  Patient presents with  . Follow-up    HPI Tina Hensley, 54 year old female with history of prediabetes with last hemoglobin A1c of 5.5 on 12/12/2018, and history of hypertension that in the past has been uncontrolled, who presents for follow-up.  She was last seen in the office on 12/12/2018 and has since followed up with the clinical pharmacist.  She has also been seen in the emergency department on 01/11/2019 and diagnosed with right-sided trigeminal neuralgia.  Tegretol has helped control the facial pain.  She continues to take her blood pressure medications and she has had no headaches or dizziness related to her blood pressure.  When she has checked her blood pressure it has been controlled.  She has had no issues with increased thirst or urinary frequency suggestive of increased blood sugar.  Past Medical History:  Diagnosis Date  . Hypertension     Past Surgical History:  Procedure Laterality Date  . NO PAST SURGERIES      Family History  Problem Relation Age of Onset  . Hypertension Mother   . Diabetes Mother   . Hypertension Father   . Diabetes Father     Social History   Socioeconomic History  . Marital status: Married    Spouse name: Not on file  . Number of children: Not on file  . Years of education: Not on file  . Highest education level: Not on file  Occupational History  . Not on file  Tobacco Use  . Smoking status: Former Smoker    Packs/day: 0.00  . Smokeless tobacco: Never Used  Substance and Sexual Activity  . Alcohol use: Yes    Alcohol/week: 1.0 standard drinks    Types: 1 Cans of beer per week  . Drug use: Not Currently    Types: Marijuana  . Sexual activity: Not on file  Other Topics Concern  . Not on file  Social History Narrative  . Not on file   Social Determinants of Health   Financial  Resource Strain:   . Difficulty of Paying Living Expenses: Not on file  Food Insecurity:   . Worried About Charity fundraiser in the Last Year: Not on file  . Ran Out of Food in the Last Year: Not on file  Transportation Needs:   . Lack of Transportation (Medical): Not on file  . Lack of Transportation (Non-Medical): Not on file  Physical Activity:   . Days of Exercise per Week: Not on file  . Minutes of Exercise per Session: Not on file  Stress:   . Feeling of Stress : Not on file  Social Connections:   . Frequency of Communication with Friends and Family: Not on file  . Frequency of Social Gatherings with Friends and Family: Not on file  . Attends Religious Services: Not on file  . Active Member of Clubs or Organizations: Not on file  . Attends Archivist Meetings: Not on file  . Marital Status: Not on file  Intimate Partner Violence:   . Fear of Current or Ex-Partner: Not on file  . Emotionally Abused: Not on file  . Physically Abused: Not on file  . Sexually Abused: Not on file    Outpatient Medications Prior to Visit  Medication Sig Dispense Refill  . amLODipine (NORVASC) 10 MG tablet Take 1 tablet (  10 mg total) by mouth daily. To lower blood pressure 30 tablet 3  . guaiFENesin (MUCINEX) 600 MG 12 hr tablet Take 2 tablets (1,200 mg total) by mouth 2 (two) times daily.    Marland Kitchen losartan-hydrochlorothiazide (HYZAAR) 50-12.5 MG tablet Take 1 tablet by mouth daily. To lower blood pressure 30 tablet 3  . carbamazepine (TEGRETOL) 200 MG tablet Take 1 tablet (200 mg total) by mouth 2 (two) times daily for 21 days. 60 tablet 1   No facility-administered medications prior to visit.    No Known Allergies  ROS Review of Systems  Constitutional: Positive for fatigue (mild). Negative for chills and fever.  HENT: Negative for sore throat and trouble swallowing.   Eyes: Positive for visual disturbance (needs glasses). Negative for photophobia.  Cardiovascular: Negative for  chest pain, palpitations and leg swelling.  Gastrointestinal: Negative for abdominal pain, blood in stool, constipation and diarrhea.  Endocrine: Negative for polydipsia, polyphagia and polyuria.  Genitourinary: Negative for dysuria and frequency.  Musculoskeletal: Negative for arthralgias and back pain.  Neurological: Negative for dizziness and headaches.  Hematological: Negative for adenopathy. Does not bruise/bleed easily.  Psychiatric/Behavioral: Negative for self-injury and suicidal ideas.      Objective:    Physical Exam  Constitutional: She is oriented to person, place, and time. She appears well-developed and well-nourished.  Neck: No JVD present.  Cardiovascular: Normal rate and regular rhythm.  No carotid bruit  Abdominal: Soft. There is no abdominal tenderness. There is no rebound and no guarding.  Musculoskeletal:        General: No tenderness or edema.     Cervical back: Normal range of motion and neck supple.     Comments: No CVA tenderness  Lymphadenopathy:    She has no cervical adenopathy.  Neurological: She is alert and oriented to person, place, and time.  Skin: Skin is warm and dry.  Psychiatric: She has a normal mood and affect. Her behavior is normal.  Nursing note and vitals reviewed.  BP 130/85 (BP Location: Left Arm, Patient Position: Sitting, Cuff Size: Normal)   Pulse 86   Temp (!) 96.1 F (35.6 C) (Oral)   Ht 5' 5"  (1.651 m)   Wt 150 lb 6.4 oz (68.2 kg)   SpO2 98%   BMI 25.03 kg/m   Wt Readings from Last 3 Encounters:  04/17/19 150 lb 6.4 oz (68.2 kg)  01/15/19 150 lb (68 kg)  01/11/19 152 lb (68.9 kg)     Health Maintenance Due  Topic Date Due  . PNEUMOCOCCAL POLYSACCHARIDE VACCINE AGE 93-64 HIGH RISK  03/22/1966  . HIV Screening  03/23/1979  . TETANUS/TDAP  03/23/1983  . PAP SMEAR-Modifier  03/22/1985  . MAMMOGRAM  03/22/2014  . COLONOSCOPY  03/22/2014      No results found for: TSH Lab Results  Component Value Date   WBC 6.2  01/11/2019   HGB 13.9 01/11/2019   HCT 40.8 01/11/2019   MCV 92.1 01/11/2019   PLT 373 01/11/2019   Lab Results  Component Value Date   NA 137 01/11/2019   K 3.6 01/11/2019   CO2 24 01/11/2019   GLUCOSE 99 01/11/2019   BUN 11 01/11/2019   CREATININE 0.64 01/11/2019   BILITOT 0.9 12/03/2015   ALKPHOS 54 12/03/2015   AST 23 12/03/2015   ALT 15 12/03/2015   PROT 7.7 12/03/2015   ALBUMIN 4.3 12/03/2015   CALCIUM 9.8 01/11/2019   ANIONGAP 11 01/11/2019   Lab Results  Component Value Date  CHOL 150 01/10/2019   Lab Results  Component Value Date   HDL 74 01/10/2019   Lab Results  Component Value Date   LDLCALC 62 01/10/2019   Lab Results  Component Value Date   TRIG 72 01/10/2019   Lab Results  Component Value Date   CHOLHDL 2.0 01/10/2019   Lab Results  Component Value Date   HGBA1C 5.5 12/12/2018      Assessment & Plan:  1. Prediabetes Patient has had prior abnormal hemoglobin A1c of 5.8 on 04/14/2015.  Most recent hemoglobin A1c on 12/12/2018 was within normal at 5.5.  At today's visit, A1c elevated at 5.9 and patient agrees to start the use of Metformin to help reduce insulin resistance/slow progression of becoming diabetic.  Nonfasting glucose of 133 at today's visit.  She will also have comprehensive metabolic panel at today's visit.  Handout provided on preventing type 2 diabetes as part of after visit summary. - Comprehensive metabolic panel - Glucose (CBG) - HgB A1c - metFORMIN (GLUCOPHAGE) 500 MG tablet; Take 1 tablet (500 mg total) by mouth 2 (two) times daily with a meal. Or 2 pills once daily after a meal  Dispense: 180 tablet; Refill: 3  2. Essential hypertension Blood pressure stable and controlled on her current medication.  Refills provided for losartan hydrochlorothiazide 50-12.5 mg and amlodipine 10 mg.  Continue a healthy diet and regular exercise. - Comprehensive metabolic panel - losartan-hydrochlorothiazide (HYZAAR) 50-12.5 MG tablet; Take 1  tablet by mouth daily. To lower blood pressure  Dispense: 90 tablet; Refill: 1 - amLODipine (NORVASC) 10 MG tablet; Take 1 tablet (10 mg total) by mouth daily. To lower blood pressure  Dispense: 90 tablet; Refill: 1  3. Trigeminal neuralgia of right side of face Patient is status post emergency department visit in October 2020 with diagnosis of trigeminal neuralgia and has had improvement in symptoms with use of Tegretol for which she is provided with refill.  She will have comprehensive metabolic panel in follow-up of use of Tegretol. - Comprehensive metabolic panel - carbamazepine (TEGRETOL) 200 MG tablet; Take 1 tablet (200 mg total) by mouth 2 (two) times daily.  Dispense: 60 tablet; Refill: 2  4. Encounter for long-term current use of medication Comprehensive metabolic panel in follow-up of long-term use of medications for treatment of hypertension and use of Tegretol for trigeminal neuralgia. - Comprehensive metabolic panel  5. Screening for colon cancer Screening for colon cancer discussed.  Patient would like to do fecal occult blood testing and she was provided with the kit and instructions for completion at today's visit. - Fecal occult blood, imunochemical([facility])  6. Encounter for screening mammogram for malignant neoplasm of breast Health maintenance issues discussed at today's visit and she agrees to be referred for screening mammogram.  Information provided on scholarship program to help with the cost of mammogram. - MM Digital Screening; Future   An After Visit Summary was printed and given to the patient.   Follow-up: Return in about 6 months (around 10/15/2019) for HTN/prediabetes; sooner if needed for other issues/yearly well exam.  Antony Blackbird, MD

## 2019-04-17 NOTE — Progress Notes (Signed)
Pt. Is here for 4 months follow up.

## 2019-04-17 NOTE — Patient Instructions (Addendum)
Your blood pressure has been well controlled. Continue your current medications, as well as avoiding extra salt in your diet. You have been scheduled for a mammogram- please call if you have not been called in the next 2 weeks about your mammogram.  Preventing Type 2 Diabetes Mellitus Type 2 diabetes (type 2 diabetes mellitus) is a long-term (chronic) disease that affects blood sugar (glucose) levels. Normally, a hormone called insulin allows glucose to enter cells in the body. The cells use glucose for energy. In type 2 diabetes, one or both of these problems may be present:  The body does not make enough insulin.  The body does not respond properly to insulin that it makes (insulin resistance). Insulin resistance or lack of insulin causes excess glucose to build up in the blood instead of going into cells. As a result, high blood glucose (hyperglycemia) develops, which can cause many complications. Being overweight or obese and having an inactive (sedentary) lifestyle can increase your risk for diabetes. Type 2 diabetes can be delayed or prevented by making certain nutrition and lifestyle changes. What nutrition changes can be made?   Eat healthy meals and snacks regularly. Keep a healthy snack with you for when you get hungry between meals, such as fruit or a handful of nuts.  Eat lean meats and proteins that are low in saturated fats, such as chicken, fish, egg whites, and beans. Avoid processed meats.  Eat plenty of fruits and vegetables and plenty of grains that have not been processed (whole grains). It is recommended that you eat: ? 1?2 cups of fruit every day. ? 2?3 cups of vegetables every day. ? 6?8 oz of whole grains every day, such as oats, whole wheat, bulgur, brown rice, quinoa, and millet.  Eat low-fat dairy products, such as milk, yogurt, and cheese.  Eat foods that contain healthy fats, such as nuts, avocado, olive oil, and canola oil.  Drink water throughout the day.  Avoid drinks that contain added sugar, such as soda or sweet tea.  Follow instructions from your health care provider about specific eating or drinking restrictions.  Control how much food you eat at a time (portion size). ? Check food labels to find out the serving sizes of foods. ? Use a kitchen scale to weigh amounts of foods.  Saute or steam food instead of frying it. Cook with water or broth instead of oils or butter.  Limit your intake of: ? Salt (sodium). Have no more than 1 tsp (2,400 mg) of sodium a day. If you have heart disease or high blood pressure, have less than ? tsp (1,500 mg) of sodium a day. ? Saturated fat. This is fat that is solid at room temperature, such as butter or fat on meat. What lifestyle changes can be made? Activity   Do moderate-intensity physical activity for at least 30 minutes on at least 5 days of the week, or as much as told by your health care provider.  Ask your health care provider what activities are safe for you. A mix of physical activities may be best, such as walking, swimming, cycling, and strength training.  Try to add physical activity into your day. For example: ? Park in spots that are farther away than usual, so that you walk more. For example, park in a far corner of the parking lot when you go to the office or the grocery store. ? Take a walk during your lunch break. ? Use stairs instead of elevators or escalators. Weight  Loss  Lose weight as directed. Your health care provider can determine how much weight loss is best for you and can help you lose weight safely.  If you are overweight or obese, you may be instructed to lose at least 5?7 % of your body weight. Alcohol and Tobacco   Limit alcohol intake to no more than 1 drink a day for nonpregnant women and 2 drinks a day for men. One drink equals 12 oz of beer, 5 oz of wine, or 1 oz of hard liquor.  Do not use any tobacco products, such as cigarettes, chewing tobacco, and  e-cigarettes. If you need help quitting, ask your health care provider. Work With Your Health Care Provider  Have your blood glucose tested regularly, as told by your health care provider.  Discuss your risk factors and how you can reduce your risk for diabetes.  Get screening tests as told by your health care provider. You may have screening tests regularly, especially if you have certain risk factors for type 2 diabetes.  Make an appointment with a diet and nutrition specialist (registered dietitian). A registered dietitian can help you make a healthy eating plan and can help you understand portion sizes and food labels. Why are these changes important?  It is possible to prevent or delay type 2 diabetes and related health problems by making lifestyle and nutrition changes.  It can be difficult to recognize signs of type 2 diabetes. The best way to avoid possible damage to your body is to take actions to prevent the disease before you develop symptoms. What can happen if changes are not made?  Your blood glucose levels may keep increasing. Having high blood glucose for a long time is dangerous. Too much glucose in your blood can damage your blood vessels, heart, kidneys, nerves, and eyes.  You may develop prediabetes or type 2 diabetes. Type 2 diabetes can lead to many chronic health problems and complications, such as: ? Heart disease. ? Stroke. ? Blindness. ? Kidney disease. ? Depression. ? Poor circulation in the feet and legs, which could lead to surgical removal (amputation) in severe cases. Where to find support  Ask your health care provider to recommend a registered dietitian, diabetes educator, or weight loss program.  Look for local or online weight loss groups.  Join a gym, fitness club, or outdoor activity group, such as a walking club. Where to find more information To learn more about diabetes and diabetes prevention, visit:  American Diabetes Association (ADA):  www.diabetes.[employer] of Diabetes and Digestive and Kidney Diseases: ToyArticles.ca To learn more about healthy eating, visit:  The U.S. Department of Agriculture Architect), Choose My Plate: http://yates.biz/  Office of Disease Prevention and Health Promotion (ODPHP), Dietary Guidelines: ListingMagazine.si Summary  You can reduce your risk for type 2 diabetes by increasing your physical activity, eating healthy foods, and losing weight as directed.  Talk with your health care provider about your risk for type 2 diabetes. Ask about any blood tests or screening tests that you need to have. This information is not intended to replace advice given to you by your health care provider. Make sure you discuss any questions you have with your health care provider. Document Revised: 06/29/2018 Document Reviewed: 04/28/2015 Elsevier Patient Education  2020 [employer].

## 2019-04-18 LAB — COMPREHENSIVE METABOLIC PANEL WITH GFR
ALT: 10 IU/L (ref 0–32)
AST: 8 IU/L (ref 0–40)
Albumin/Globulin Ratio: 1.5 (ref 1.2–2.2)
Albumin: 4.3 g/dL (ref 3.8–4.9)
Alkaline Phosphatase: 72 IU/L (ref 39–117)
BUN/Creatinine Ratio: 18 (ref 9–23)
BUN: 13 mg/dL (ref 6–24)
Bilirubin Total: 0.4 mg/dL (ref 0.0–1.2)
CO2: 23 mmol/L (ref 20–29)
Calcium: 9.9 mg/dL (ref 8.7–10.2)
Chloride: 101 mmol/L (ref 96–106)
Creatinine, Ser: 0.72 mg/dL (ref 0.57–1.00)
GFR calc Af Amer: 109 mL/min/1.73
GFR calc non Af Amer: 95 mL/min/1.73
Globulin, Total: 2.8 g/dL (ref 1.5–4.5)
Glucose: 94 mg/dL (ref 65–99)
Potassium: 4.6 mmol/L (ref 3.5–5.2)
Sodium: 139 mmol/L (ref 134–144)
Total Protein: 7.1 g/dL (ref 6.0–8.5)

## 2019-04-30 MED FILL — metFORMIN HCL 500 MG TABS: 500 | 30 days supply | Qty: 60 | Fill #0

## 2019-05-01 LAB — FECAL OCCULT BLOOD, IMMUNOCHEMICAL: Fecal Occult Bld: NEGATIVE

## 2019-05-03 ENCOUNTER — Telehealth (INDEPENDENT_AMBULATORY_CARE_PROVIDER_SITE_OTHER): Payer: Self-pay

## 2019-05-03 NOTE — Telephone Encounter (Signed)
Patient verified date of birth. She is aware that FIT was negative. She verbalized understanding. Maryjean Morn, CMA

## 2019-05-03 NOTE — Telephone Encounter (Signed)
-----   Message from Cain Saupe, MD sent at 05/02/2019  7:27 PM EST ----- Negative fecal occult blood test

## 2019-07-19 MED FILL — LOSARTAN-HCTZ 50-12.5 MG TA: 50-12.5 | 30 days supply | Qty: 30 | Fill #3

## 2019-07-19 MED FILL — carBAMazepine 200 MG TABS: 200 | 30 days supply | Qty: 60 | Fill #0

## 2019-07-19 MED FILL — METFORMIN HCL 500 MG TABS: 500 | 30 days supply | Qty: 60 | Fill #1

## 2019-07-19 MED FILL — AMLODIPINE BESYLATE 10 MG T: 10 | 30 days supply | Qty: 30 | Fill #2

## 2019-09-11 MED FILL — METFORMIN HCL 500 MG TABS: 500 | 30 days supply | Qty: 60 | Fill #1

## 2019-09-11 MED FILL — LOSARTAN-HCTZ 50-12.5 MG TA: 50-12.5 | 30 days supply | Qty: 30 | Fill #3

## 2019-09-11 MED FILL — AMLODIPINE BESYLATE 10 MG T: 10 | 30 days supply | Qty: 30 | Fill #2

## 2019-09-11 MED FILL — carBAMazepine 200 MG TABS: 200 | 30 days supply | Qty: 60 | Fill #0

## 2019-10-17 ENCOUNTER — Ambulatory Visit: Payer: Self-pay | Attending: Family | Admitting: Family

## 2019-10-17 ENCOUNTER — Other Ambulatory Visit: Payer: Self-pay

## 2019-10-17 DIAGNOSIS — Z91199 Patient's noncompliance with other medical treatment and regimen due to unspecified reason: Secondary | ICD-10-CM

## 2019-10-17 DIAGNOSIS — Z5329 Procedure and treatment not carried out because of patient's decision for other reasons: Secondary | ICD-10-CM

## 2019-10-17 NOTE — Progress Notes (Signed)
Patient did not show for appointment. This nurse practitioner attempted to call patient at [phone], [phone], and [phone] with no response. Voicemail was left each time.

## 2021-06-10 IMAGING — CT CT HEAD W/O CM
3 series · 15 of 47 positions shown, 18 images · non-contrast
Comparison: 11/19/2007

CLINICAL DATA: Headache, facial pain and burning

EXAM:
CT HEAD WITHOUT CONTRAST
TECHNIQUE: Contiguous axial images were obtained from the base of the skull
through the vertex without intravenous contrast.

[Series 2: head 5.0 h30s · axial · 0.43mm/px · z∈[+458,+598]mm · 9 of 34 slices shown, 12 images]
[im 3/34  brain]
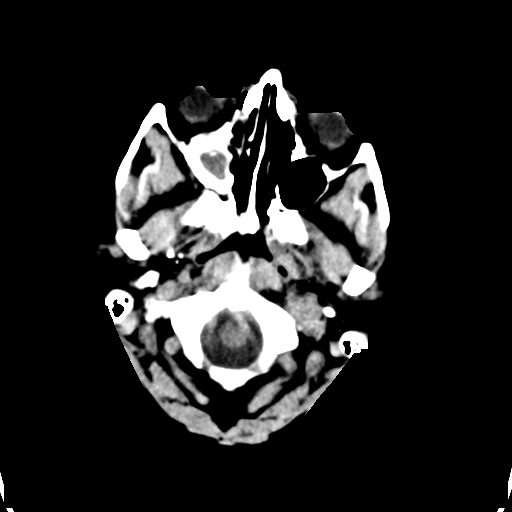
[im 3/34  bone]
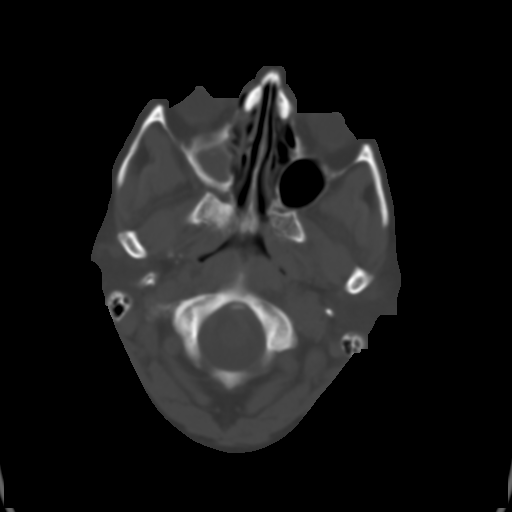
[im 6/34  brain]
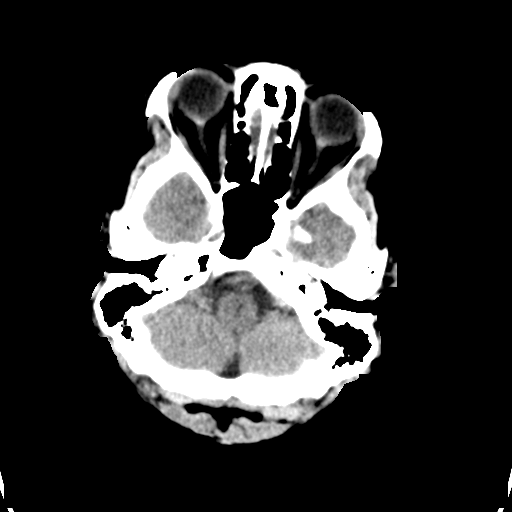
[im 10/34  brain]
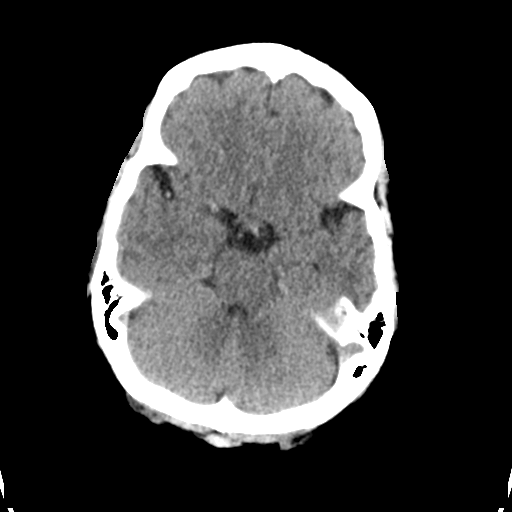
[im 13/34  brain]
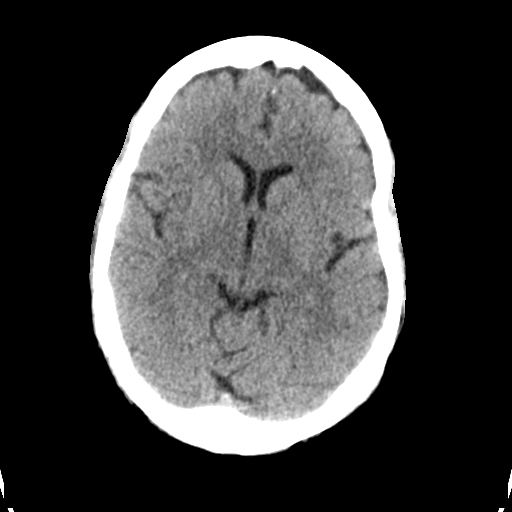
[im 18/34  brain]
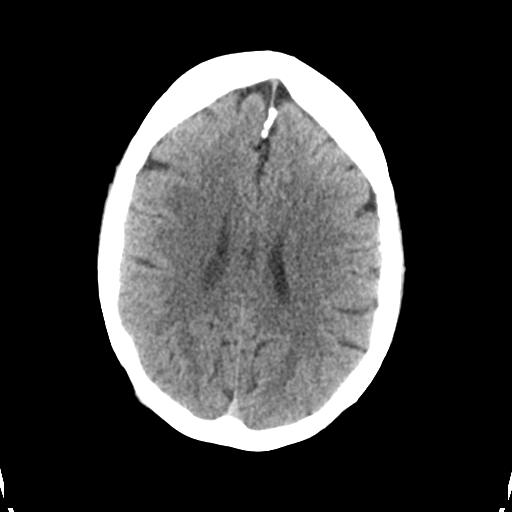
[im 18/34  bone]
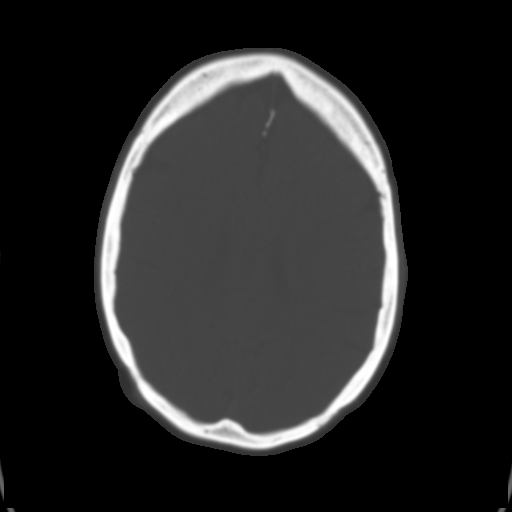
[im 21/34  brain]
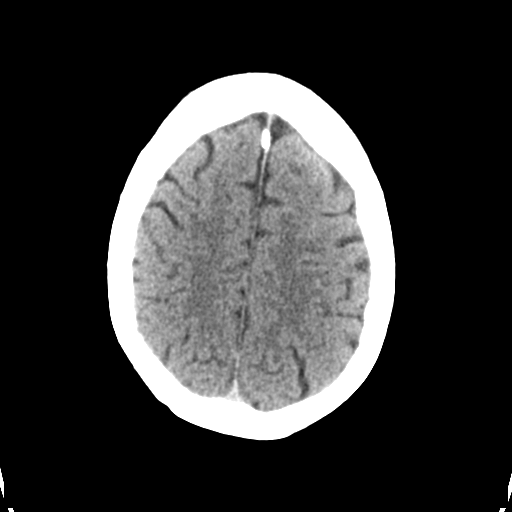
[im 24/34  brain]
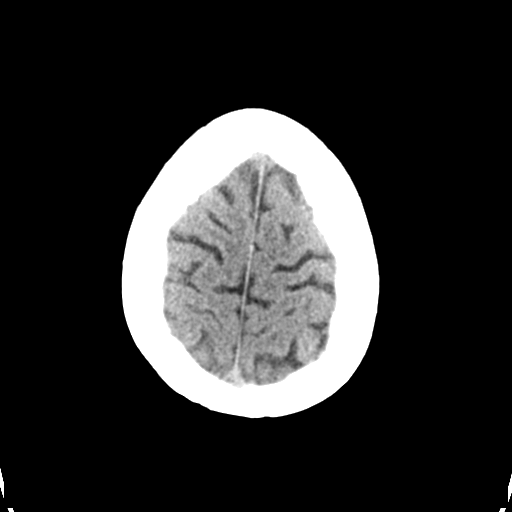
[im 28/34  brain]
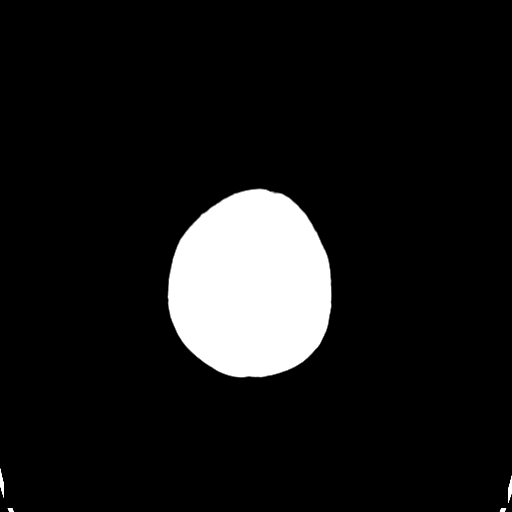
[im 31/34  brain]
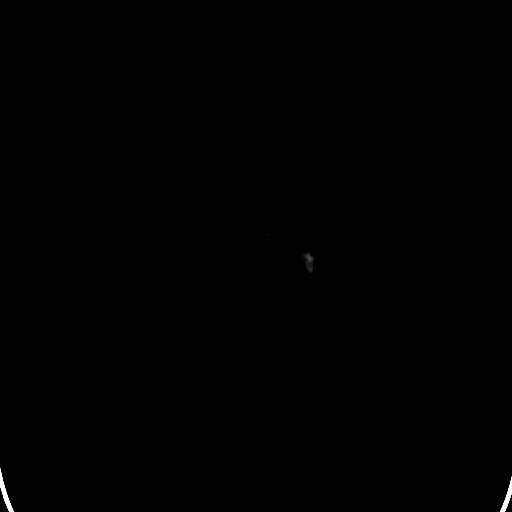
[im 31/34  bone]
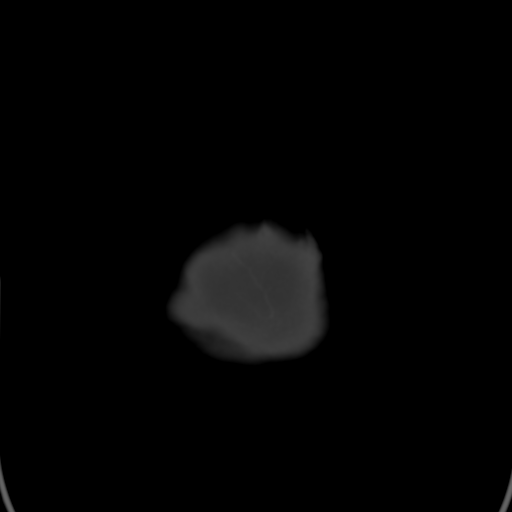

[Series 4: head 3.0 mpr cor · coronal · 0.31mm/px · 3 of 67 slices shown]
[im 23/67  brain]
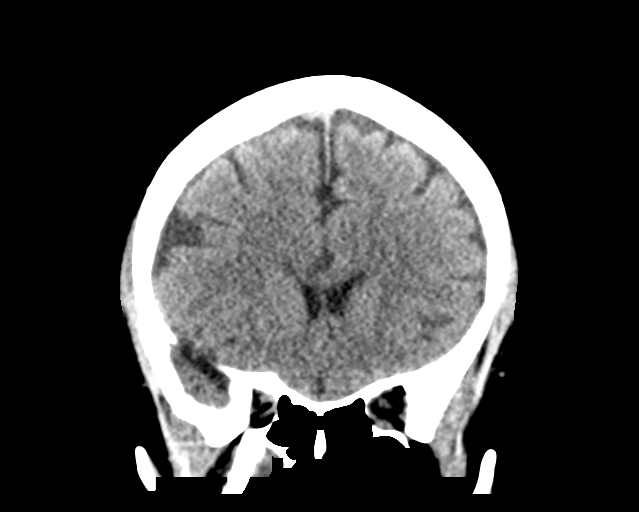
[im 30/67  brain]
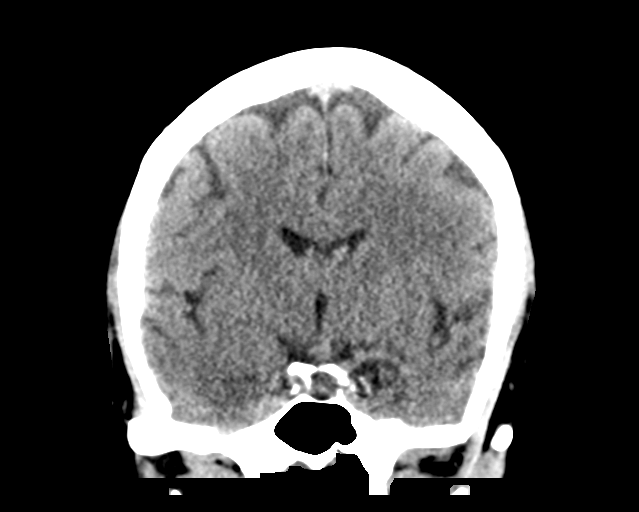
[im 37/67  brain]
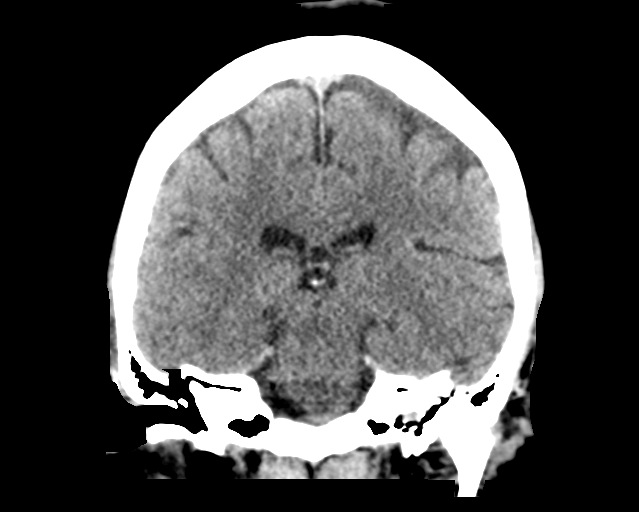

[Series 5: head 3.0 mpr sag · sagittal · 0.31mm/px · 3 of 62 slices shown]
[im 21/62  brain]
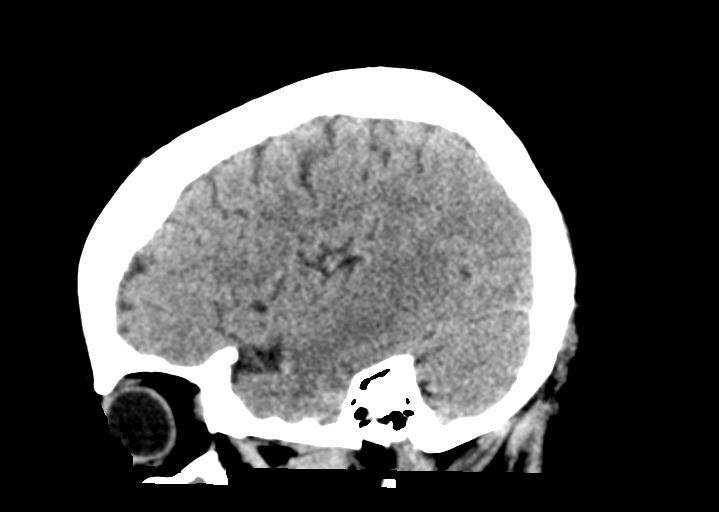
[im 31/62  brain]
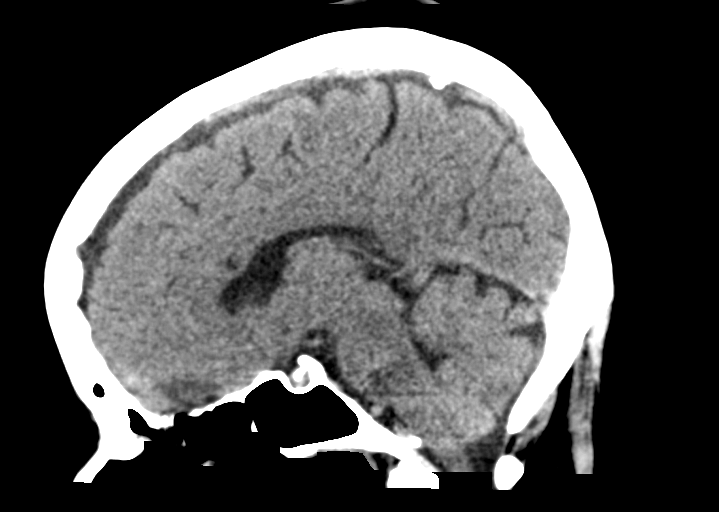
[im 41/62  brain]
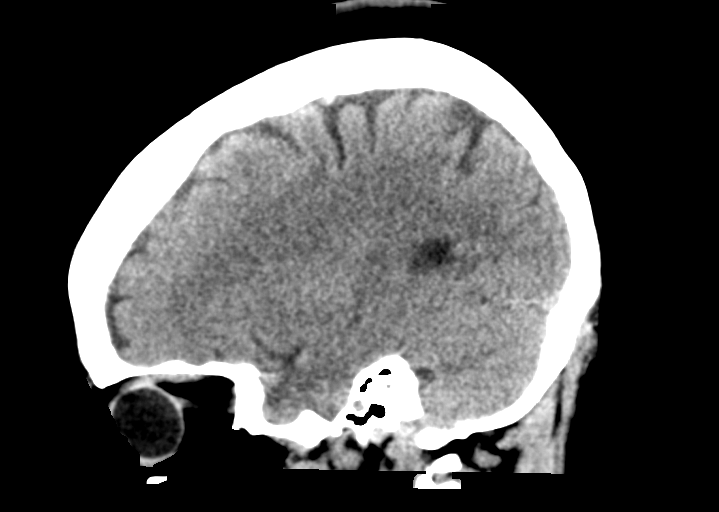

[15 of 47 positions shown; findings below may reference images not displayed]

FINDINGS: Brain: No evidence of acute infarction, hemorrhage, hydrocephalus,
extra-axial collection or mass lesion/mass effect.

Vascular: No hyperdense vessel or unexpected calcification.

Skull: Normal. Negative for fracture or focal lesion.

Sinuses/Orbits: Total opacification of the right maxillary sinus
with bony thickening of the sinus walls.

Other: None.
IMPRESSION: 1. No acute intracranial pathology. No non-contrast intracranial
findings to explain headache.

2. Total opacification of the right maxillary sinus with bony
thickening of the sinus walls, generally consistent with chronic
sinusitis. Correlate with clinical symptoms.

## 2021-09-08 ENCOUNTER — Ambulatory Visit: Payer: Self-pay

## 2021-09-08 NOTE — Telephone Encounter (Signed)
    Chief Complaint: Pt. Has been out of BP medications x 1 year. Symptoms: Has lightheadedness at times. Frequency: 2 weeks Pertinent Negatives: Patient denies any other symptoms. Disposition: [] ED /[] Urgent Care (no appt availability in office) / [x] Appointment(In office/virtual)/ []  [facility]/ [] Home Care/ [] Refused Recommended Disposition /[] [facility]/ []  Follow-up with PCP Additional Notes: Will go to ED for worsening of symptoms.  Reason for Disposition  Systolic BP  >= 180 OR Diastolic >= 110  Answer Assessment - Initial Assessment Questions 1. BLOOD PRESSURE: "What is the blood pressure?" "Did you take at least two measurements 5 minutes apart?"     197/100 last month 2. ONSET: "When did you take your blood pressure?"     Months 3. HOW: "How did you obtain the blood pressure?" (e.g., visiting nurse, automatic home BP monitor)     Nurse 4. HISTORY: "Do you have a history of high blood pressure?"     Yes 5. MEDICATIONS: "Are you taking any medications for blood pressure?" "Have you missed any doses recently?"     Has been out for 1 year 6. OTHER SYMPTOMS: "Do you have any symptoms?" (e.g., headache, chest pain, blurred vision, difficulty breathing, weakness)     Lightheaded  7. PREGNANCY: "Is there any chance you are pregnant?" "When was your last menstrual period?"     No  Protocols used: Blood Pressure - High-A-AH

## 2021-09-09 ENCOUNTER — Ambulatory Visit: Payer: Self-pay | Attending: Family Medicine | Admitting: Family Medicine

## 2021-09-09 ENCOUNTER — Encounter: Payer: Self-pay | Admitting: Family Medicine

## 2021-09-09 ENCOUNTER — Other Ambulatory Visit: Payer: Self-pay

## 2021-09-09 ENCOUNTER — Ambulatory Visit: Payer: Self-pay

## 2021-09-09 VITALS — BP 167/94 | HR 90 | Temp 98.6°F | Ht 65.0 in | Wt 159.4 lb

## 2021-09-09 DIAGNOSIS — R7303 Prediabetes: Secondary | ICD-10-CM

## 2021-09-09 DIAGNOSIS — G500 Trigeminal neuralgia: Secondary | ICD-10-CM

## 2021-09-09 DIAGNOSIS — R21 Rash and other nonspecific skin eruption: Secondary | ICD-10-CM

## 2021-09-09 DIAGNOSIS — I10 Essential (primary) hypertension: Secondary | ICD-10-CM

## 2021-09-09 DIAGNOSIS — Z1159 Encounter for screening for other viral diseases: Secondary | ICD-10-CM

## 2021-09-09 LAB — POCT GLYCOSYLATED HEMOGLOBIN (HGB A1C): HbA1c, POC (controlled diabetic range): 6 % (ref 0.0–7.0)

## 2021-09-09 MED ORDER — AMLODIPINE BESYLATE 10 MG PO TABS
10.0000 mg | ORAL_TABLET | Freq: Every day | ORAL | 1 refills | Status: DC
  Filled 2021-09-09: qty 90, 90d supply, fill #0
  Filled 2022-02-22: qty 30, 30d supply, fill #1

## 2021-09-09 MED ORDER — PREDNISONE 20 MG PO TABS
20.0000 mg | ORAL_TABLET | Freq: Every day | ORAL | 0 refills | Status: DC
  Filled 2021-09-09: qty 5, 5d supply, fill #0

## 2021-09-09 MED ORDER — LOSARTAN POTASSIUM-HCTZ 50-12.5 MG PO TABS
1.0000 | ORAL_TABLET | Freq: Every day | ORAL | 1 refills | Status: DC
  Filled 2021-09-09: qty 90, 90d supply, fill #0
  Filled 2022-02-22: qty 30, 30d supply, fill #1

## 2021-09-09 NOTE — Telephone Encounter (Signed)
Summary: discuss medication   Patient inquiring if she still should take carbamazepine (TEGRETOL) 200 MG tablet (Expired)   Please advise      Chief Complaint: tegretol prescription expired Symptoms: none Frequency: takes as needed for headache Pertinent Negatives: Patient denies sx Disposition: [] ED /[] Urgent Care (no appt availability in office) / [] Appointment(In office/virtual)/ []  [facility]/ [] Home Care/ [] Refused Recommended Disposition /[] [facility]/ []  Follow-up with PCP Additional Notes: Last RF Tegretol 04/17/19 expired 05/17/19 will sent separate request for refill.  Reason for Disposition  [1] Prescription refill request for ESSENTIAL medicine (i.e., likelihood of harm to patient if not taken) AND [2] triager unable to refill per department policy  Answer Assessment - Initial Assessment Questions 1. DRUG NAME: "What medicine do you need to have refilled?"     Tegretol 2. REFILLS REMAINING: "How many refills are remaining?" (Note: The label on the medicine or pill bottle will show how many refills are remaining. If there are no refills remaining, then a renewal may be needed.)     expired 3. EXPIRATION DATE: "What is the expiration date?" (Note: The label states when the prescription will expire, and thus can no longer be refilled.)     05/17/19 4. PRESCRIBING HCP: "Who prescribed it?" Reason: If prescribed by specialist, call should be referred to that group.     Dr 5. SYMPTOMS: "Do you have any symptoms?"     no  Protocols used: Medication Refill and Renewal Call-A-AH

## 2021-09-09 NOTE — Telephone Encounter (Signed)
No. Prescription is from [facility] and she needs to contact them for refills.

## 2021-09-09 NOTE — Progress Notes (Signed)
Subjective:  Patient ID: Tina Hensley, female    DOB: [birth year]  Age: 56 y.o. MRN: [id number]  CC: Hypertension   HPI Tina Hensley is a 56 y.o. year old female with a history of prediabetes, hypertension who presents today for an office visit. Last visit to the clinic was 3 years ago.  Interval History: 1 week ago she broke out in a rash which is pruritic on her arms and ears and she has been using Zyrtec and Hydrocortisone which have been ineffective.  She is unsure if her husband changed her detergents but denies known history of exposure to allergens.  Blood pressure is elevated and she has been without her antihypertensives.  States she has been busy working and caring for her grandkids.  She was placed on Tegretol by [facility] for Trigeminal Neuralgia which she takes sparingly as she states she does not like how it makes her feel. Past Medical History:  Diagnosis Date   Hypertension     Past Surgical History:  Procedure Laterality Date   NO PAST SURGERIES      Family History  Problem Relation Age of Onset   Hypertension Mother    Diabetes Mother    Hypertension Father    Diabetes Father     Social History   Socioeconomic History   Marital status: Married    Spouse name: Not on file   Number of children: Not on file   Years of education: Not on file   Highest education level: Not on file  Occupational History   Not on file  Tobacco Use   Smoking status: Former    Packs/day: 0.00    Types: Cigarettes   Smokeless tobacco: Never  Vaping Use   Vaping Use: Never used  Substance and Sexual Activity   Alcohol use: Yes    Alcohol/week: 1.0 standard drink of alcohol    Types: 1 Cans of beer per week   Drug use: Not Currently    Types: Marijuana   Sexual activity: Not on file  Other Topics Concern   Not on file  Social History Narrative   Not on file   Social Determinants of Health   Financial Resource Strain: Not on file  Food Insecurity: Not on file   Transportation Needs: Not on file  Physical Activity: Not on file  Stress: Not on file  Social Connections: Not on file    No Known Allergies  Outpatient Medications Prior to Visit  Medication Sig Dispense Refill   amLODipine (NORVASC) 10 MG tablet Take 1 tablet (10 mg total) by mouth daily. To lower blood pressure 90 tablet 1   losartan-hydrochlorothiazide (HYZAAR) 50-12.5 MG tablet Take 1 tablet by mouth daily. To lower blood pressure 90 tablet 1   carbamazepine (TEGRETOL) 200 MG tablet Take 1 tablet (200 mg total) by mouth 2 (two) times daily. 60 tablet 2   guaiFENesin (MUCINEX) 600 MG 12 hr tablet Take 2 tablets (1,200 mg total) by mouth 2 (two) times daily. (Patient not taking: Reported on 09/09/2021)     metFORMIN (GLUCOPHAGE) 500 MG tablet Take 1 tablet (500 mg total) by mouth 2 (two) times daily with a meal. Or 2 pills once daily after a meal (Patient not taking: Reported on 09/09/2021) 180 tablet 3   No facility-administered medications prior to visit.     ROS Review of Systems  Constitutional:  Negative for activity change and appetite change.  HENT:  Negative for sinus pressure and sore throat.   Respiratory:  Negative for chest tightness, shortness of breath and wheezing.   Cardiovascular:  Negative for chest pain and palpitations.  Gastrointestinal:  Negative for abdominal distention, abdominal pain and constipation.  Genitourinary: Negative.   Musculoskeletal: Negative.   Psychiatric/Behavioral:  Negative for behavioral problems and dysphoric mood.     Objective:  BP (!) 167/94   Pulse 90   Temp 98.6 F (37 C) (Oral)   Ht 5\' 5"  (1.651 m)   Wt 159 lb 6.4 oz (72.3 kg)   SpO2 98%   BMI 26.53 kg/m      09/09/2021   11:34 AM 04/17/2019    8:41 AM 01/15/2019    3:07 PM  BP/Weight  Systolic BP 167 130 123  Diastolic BP 94 85 82  Wt. (Lbs) 159.4 150.4 150  BMI 26.53 kg/m2 25.03 kg/m2 24.96 kg/m2      Physical Exam Constitutional:      Appearance: She is  well-developed.  Cardiovascular:     Rate and Rhythm: Normal rate.     Heart sounds: Normal heart sounds. No murmur heard. Pulmonary:     Effort: Pulmonary effort is normal.     Breath sounds: Normal breath sounds. No wheezing or rales.  Chest:     Chest wall: No tenderness.  Abdominal:     General: Bowel sounds are normal. There is no distension.     Palpations: Abdomen is soft. There is no mass.     Tenderness: There is no abdominal tenderness.  Musculoskeletal:        General: Normal range of motion.     Right lower leg: No edema.     Left lower leg: No edema.  Neurological:     Mental Status: She is alert and oriented to person, place, and time.  Psychiatric:        Mood and Affect: Mood normal.        Latest Ref Rng & Units 04/17/2019    9:33 AM 01/11/2019    9:49 PM 01/10/2019    9:28 AM  CMP  Glucose 65 - 99 mg/dL 94  99  98   BUN 6 - 24 mg/dL 13  11  12    Creatinine 0.57 - 1.00 mg/dL 01/12/2019   9.35   Sodium 134 - 144 mmol/L 139  137  139   Potassium 3.5 - 5.2 mmol/L 4.6  3.6  4.4   Chloride 96 - 106 mmol/L 101  102  103   CO2 20 - 29 mmol/L 23  24  24    Calcium 8.7 - 10.2 mg/dL 9.9  9.8  9.6   Total Protein 6.0 - 8.5 g/dL 7.1     Total Bilirubin 0.0 - 1.2 mg/dL 0.4     Alkaline Phos 39 - 117 IU/L 72     AST 0 - 40 IU/L 8     ALT 0 - 32 IU/L 10       Lipid Panel     Component Value Date/Time   CHOL 150 01/10/2019 0928   TRIG 72 01/10/2019 0928   HDL 74 01/10/2019 0928   CHOLHDL 2.0 01/10/2019 0928   LDLCALC 62 01/10/2019 0928    CBC    Component Value Date/Time   WBC 6.2 01/11/2019 2149   RBC 4.43 01/11/2019 2149   HGB 13.9 01/11/2019 2149   HCT 40.8 01/11/2019 2149   PLT 373 01/11/2019 2149   MCV 92.1 01/11/2019 2149   MCH 31.4 01/11/2019 2149   MCHC 34.1 01/11/2019 2149  RDW 13.5 01/11/2019 2149   LYMPHSABS 2.4 01/11/2019 2149   MONOABS 0.6 01/11/2019 2149   EOSABS 0.1 01/11/2019 2149   BASOSABS 0.0 01/11/2019 2149    Lab Results   Component Value Date   HGBA1C 6.0 09/09/2021    Assessment & Plan:  1. Essential hypertension Uncontrolled due to running out of medications which I have refilled Counseled on blood pressure goal of less than 130/80, low-sodium, DASH diet, medication compliance, 150 minutes of moderate intensity exercise per week. Discussed medication compliance, adverse effects. - Basic Metabolic Panel - amLODipine (NORVASC) 10 MG tablet; Take 1 tablet (10 mg total) by mouth daily. To lower blood pressure  Dispense: 90 tablet; Refill: 1 - losartan-hydrochlorothiazide (HYZAAR) 50-12.5 MG tablet; Take 1 tablet by mouth daily. To lower blood pressure  Dispense: 90 tablet; Refill: 1  2. Prediabetes Labs reveal prediabetes with an A1c of 6.0.  Working on a low carbohydrate diet, exercise, weight loss is recommended in order to prevent progression to type 2 diabetes mellitus.  - POCT glycosylated hemoglobin (Hb A1C)  3. Rash and nonspecific skin eruption Possibly exposed to an allergen - predniSONE (DELTASONE) 20 MG tablet; Take 1 tablet (20 mg total) by mouth daily with breakfast.  Dispense: 5 tablet; Refill: 0  4. Screening for viral disease - HCV Ab w Reflex to Quant PCR - HIV Antibody (routine testing w rflx)  Health Care Maintenance: We will address at next visit. Meds ordered this encounter  Medications   predniSONE (DELTASONE) 20 MG tablet    Sig: Take 1 tablet (20 mg total) by mouth daily with breakfast.    Dispense:  5 tablet    Refill:  0   amLODipine (NORVASC) 10 MG tablet    Sig: Take 1 tablet (10 mg total) by mouth daily. To lower blood pressure    Dispense:  90 tablet    Refill:  1   losartan-hydrochlorothiazide (HYZAAR) 50-12.5 MG tablet    Sig: Take 1 tablet by mouth daily. To lower blood pressure    Dispense:  90 tablet    Refill:  1    Follow-up: Return in about 1 month (around 10/09/2021) for CPE/ Preventive Health Exam.       Hoy Register, MD, FAAFP. [facility] [place], [place] [phone]   09/09/2021, 12:11 PM

## 2021-09-09 NOTE — Patient Instructions (Signed)
Low-Sodium Eating Plan Sodium, which is an element that makes up salt, helps you maintain a healthy balance of fluids in your body. Too much sodium can increase your blood pressure and cause fluid and waste to be held in your body. Your health care provider or dietitian may recommend following this plan if you have high blood pressure (hypertension), kidney disease, liver disease, or heart failure. Eating less sodium can help lower your blood pressure, reduce swelling, and protect your heart, liver, and kidneys. What are tips for following this plan? Reading food labels The Nutrition Facts label lists the amount of sodium in one serving of the food. If you eat more than one serving, you must multiply the listed amount of sodium by the number of servings. Choose foods with less than 140 mg of sodium per serving. Avoid foods with 300 mg of sodium or more per serving. Shopping  Look for lower-sodium products, often labeled as "low-sodium" or "no salt added." Always check the sodium content, even if foods are labeled as "unsalted" or "no salt added." Buy fresh foods. Avoid canned foods and pre-made or frozen meals. Avoid canned, cured, or processed meats. Buy breads that have less than 80 mg of sodium per slice. Cooking  Eat more home-cooked food and less restaurant, buffet, and fast food. Avoid adding salt when cooking. Use salt-free seasonings or herbs instead of table salt or sea salt. Check with your health care provider or pharmacist before using salt substitutes. Cook with plant-based oils, such as canola, sunflower, or olive oil. Meal planning When eating at a restaurant, ask that your food be prepared with less salt or no salt, if possible. Avoid dishes labeled as brined, pickled, cured, smoked, or made with soy sauce, miso, or teriyaki sauce. Avoid foods that contain MSG (monosodium glutamate). MSG is sometimes added to Chinese food, bouillon, and some canned foods. Make meals that can  be grilled, baked, poached, roasted, or steamed. These are generally made with less sodium. General information Most people on this plan should limit their sodium intake to 1,500-2,000 mg (milligrams) of sodium each day. What foods should I eat? Fruits Fresh, frozen, or canned fruit. Fruit juice. Vegetables Fresh or frozen vegetables. "No salt added" canned vegetables. "No salt added" tomato sauce and paste. Low-sodium or reduced-sodium tomato and vegetable juice. Grains Low-sodium cereals, including oats, puffed wheat and rice, and shredded wheat. Low-sodium crackers. Unsalted rice. Unsalted pasta. Low-sodium bread. Whole-grain breads and whole-grain pasta. Meats and other proteins Fresh or frozen (no salt added) meat, poultry, seafood, and fish. Low-sodium canned tuna and salmon. Unsalted nuts. Dried peas, beans, and lentils without added salt. Unsalted canned beans. Eggs. Unsalted nut butters. Dairy Milk. Soy milk. Cheese that is naturally low in sodium, such as ricotta cheese, fresh mozzarella, or Swiss cheese. Low-sodium or reduced-sodium cheese. Cream cheese. Yogurt. Seasonings and condiments Fresh and dried herbs and spices. Salt-free seasonings. Low-sodium mustard and ketchup. Sodium-free salad dressing. Sodium-free light mayonnaise. Fresh or refrigerated horseradish. Lemon juice. Vinegar. Other foods Homemade, reduced-sodium, or low-sodium soups. Unsalted popcorn and pretzels. Low-salt or salt-free chips. The items listed above may not be a complete list of foods and beverages you can eat. Contact a dietitian for more information. What foods should I avoid? Vegetables Sauerkraut, pickled vegetables, and relishes. Olives. French fries. Onion rings. Regular canned vegetables (not low-sodium or reduced-sodium). Regular canned tomato sauce and paste (not low-sodium or reduced-sodium). Regular tomato and vegetable juice (not low-sodium or reduced-sodium). Frozen vegetables in  sauces. Grains   Instant hot cereals. Bread stuffing, pancake, and biscuit mixes. Croutons. Seasoned rice or pasta mixes. Noodle soup cups. Boxed or frozen macaroni and cheese. Regular salted crackers. Self-rising flour. Meats and other proteins Meat or fish that is salted, canned, smoked, spiced, or pickled. Precooked or cured meat, such as sausages or meat loaves. Bacon. Ham. Pepperoni. Hot dogs. Corned beef. Chipped beef. Salt pork. Jerky. Pickled herring. Anchovies and sardines. Regular canned tuna. Salted nuts. Dairy Processed cheese and cheese spreads. Hard cheeses. Cheese curds. Blue cheese. Feta cheese. String cheese. Regular cottage cheese. Buttermilk. Canned milk. Fats and oils Salted butter. Regular margarine. Ghee. Bacon fat. Seasonings and condiments Onion salt, garlic salt, seasoned salt, table salt, and sea salt. Canned and packaged gravies. Worcestershire sauce. Tartar sauce. Barbecue sauce. Teriyaki sauce. Soy sauce, including reduced-sodium. Steak sauce. Fish sauce. Oyster sauce. Cocktail sauce. Horseradish that you find on the shelf. Regular ketchup and mustard. Meat flavorings and tenderizers. Bouillon cubes. Hot sauce. Pre-made or packaged marinades. Pre-made or packaged taco seasonings. Relishes. Regular salad dressings. Salsa. Other foods Salted popcorn and pretzels. Corn chips and puffs. Potato and tortilla chips. Canned or dried soups. Pizza. Frozen entrees and pot pies. The items listed above may not be a complete list of foods and beverages you should avoid. Contact a dietitian for more information. Summary Eating less sodium can help lower your blood pressure, reduce swelling, and protect your heart, liver, and kidneys. Most people on this plan should limit their sodium intake to 1,500-2,000 mg (milligrams) of sodium each day. Canned, boxed, and frozen foods are high in sodium. Restaurant foods, fast foods, and pizza are also very high in sodium. You also get sodium by  adding salt to food. Try to cook at home, eat more fresh fruits and vegetables, and eat less fast food and canned, processed, or prepared foods. This information is not intended to replace advice given to you by your health care provider. Make sure you discuss any questions you have with your health care provider. Document Revised: 04/12/2019 Document Reviewed: 02/06/2019 Elsevier Patient Education  2023 Elsevier Inc.  

## 2021-09-09 NOTE — Telephone Encounter (Signed)
Routing to PCP for review.

## 2021-09-09 NOTE — Telephone Encounter (Signed)
Pt requesting refill of Tegretol.  Last RF 04/17/19 expired 05/17/19  Requested Prescriptions  Pending Prescriptions Disp Refills   carbamazepine (TEGRETOL) 200 MG tablet 60 tablet 2    Sig: Take 1 tablet (200 mg total) by mouth 2 (two) times daily.     There is no refill protocol information for this order

## 2021-09-10 LAB — BASIC METABOLIC PANEL
BUN/Creatinine Ratio: 23 (ref 9–23)
BUN: 18 mg/dL (ref 6–24)
CO2: 21 mmol/L (ref 20–29)
Calcium: 9.9 mg/dL (ref 8.7–10.2)
Chloride: 102 mmol/L (ref 96–106)
Creatinine, Ser: 0.77 mg/dL (ref 0.57–1.00)
Glucose: 100 mg/dL — ABNORMAL HIGH (ref 70–99)
Potassium: 4.9 mmol/L (ref 3.5–5.2)
Sodium: 141 mmol/L (ref 134–144)
eGFR: 90 mL/min/{1.73_m2} (ref >59)

## 2021-09-10 LAB — HCV AB W REFLEX TO QUANT PCR: HCV Ab: NONREACTIVE

## 2021-09-10 LAB — HCV INTERPRETATION

## 2021-09-10 LAB — HIV ANTIBODY (ROUTINE TESTING W REFLEX): HIV Screen 4th Generation wRfx: NONREACTIVE

## 2021-09-10 NOTE — Telephone Encounter (Signed)
Pt was called and VM was left informing patient to reach out to [facility] for medication questions.

## 2021-10-13 ENCOUNTER — Other Ambulatory Visit (HOSPITAL_COMMUNITY)
Admission: RE | Admit: 2021-10-13 | Discharge: 2021-10-13 | Disposition: A | Discharge status: Home / Self Care | Payer: Self-pay | Source: Ambulatory Visit | Attending: Family Medicine | Admitting: Family Medicine

## 2021-10-13 ENCOUNTER — Other Ambulatory Visit: Payer: Self-pay

## 2021-10-13 ENCOUNTER — Ambulatory Visit: Payer: Self-pay | Attending: Family Medicine | Admitting: Family Medicine

## 2021-10-13 ENCOUNTER — Encounter: Payer: Self-pay | Admitting: Family Medicine

## 2021-10-13 VITALS — BP 109/73 | HR 94 | Temp 98.7°F | Ht 65.0 in | Wt 162.6 lb

## 2021-10-13 DIAGNOSIS — Z136 Encounter for screening for cardiovascular disorders: Secondary | ICD-10-CM

## 2021-10-13 DIAGNOSIS — Z124 Encounter for screening for malignant neoplasm of cervix: Secondary | ICD-10-CM

## 2021-10-13 DIAGNOSIS — Z13220 Encounter for screening for lipoid disorders: Secondary | ICD-10-CM

## 2021-10-13 DIAGNOSIS — B3731 Acute candidiasis of vulva and vagina: Secondary | ICD-10-CM

## 2021-10-13 DIAGNOSIS — Z1211 Encounter for screening for malignant neoplasm of colon: Secondary | ICD-10-CM

## 2021-10-13 DIAGNOSIS — Z1231 Encounter for screening mammogram for malignant neoplasm of breast: Secondary | ICD-10-CM

## 2021-10-13 DIAGNOSIS — Z0000 Encounter for general adult medical examination without abnormal findings: Secondary | ICD-10-CM

## 2021-10-13 MED ORDER — FLUCONAZOLE 150 MG PO TABS
150.0000 mg | ORAL_TABLET | Freq: Once | ORAL | 0 refills | Status: AC
  Filled 2021-10-13: qty 1, 1d supply, fill #0

## 2021-10-13 NOTE — Patient Instructions (Signed)

## 2021-10-13 NOTE — Progress Notes (Addendum)
Subjective:  Patient ID: Tina Hensley, female    DOB: 10-Nov-1965  Age: 56 y.o. MRN: [id number]  CC: Annual Exam and Gynecologic Exam   HPI Tina Hensley is a 56 y.o. year old female with a history of prediabetes, hypertension who presents today for complete physical exam.  Interval History: She is due for colorectal, breast cancer and cervical cancer screening. Does not visit a dentist regularly.  Denies visual concerns She exercises a lot by means of walking at work.  Past Medical History:  Diagnosis Date   Hypertension     Past Surgical History:  Procedure Laterality Date   NO PAST SURGERIES      Family History  Problem Relation Age of Onset   Hypertension Mother    Diabetes Mother    Hypertension Father    Diabetes Father     Social History   Socioeconomic History   Marital status: Married    Spouse name: Not on file   Number of children: Not on file   Years of education: Not on file   Highest education level: Not on file  Occupational History   Not on file  Tobacco Use   Smoking status: Former    Packs/day: 0.00    Types: Cigarettes   Smokeless tobacco: Never  Vaping Use   Vaping Use: Never used  Substance and Sexual Activity   Alcohol use: Yes    Alcohol/week: 1.0 standard drink of alcohol    Types: 1 Cans of beer per week   Drug use: Not Currently    Types: Marijuana   Sexual activity: Not on file  Other Topics Concern   Not on file  Social History Narrative   Not on file   Social Determinants of Health   Financial Resource Strain: Not on file  Food Insecurity: Not on file  Transportation Needs: Not on file  Physical Activity: Not on file  Stress: Not on file  Social Connections: Not on file    No Known Allergies  Outpatient Medications Prior to Visit  Medication Sig Dispense Refill   amLODipine (NORVASC) 10 MG tablet Take 1 tablet (10 mg total) by mouth daily. To lower blood pressure 90 tablet 1   losartan-hydrochlorothiazide (HYZAAR)  50-12.5 MG tablet Take 1 tablet by mouth daily. To lower blood pressure 90 tablet 1   carbamazepine (TEGRETOL) 200 MG tablet Take 1 tablet (200 mg total) by mouth 2 (two) times daily. 60 tablet 2   predniSONE (DELTASONE) 20 MG tablet Take 1 tablet (20 mg total) by mouth daily with breakfast. (Patient not taking: Reported on 10/13/2021) 5 tablet 0   No facility-administered medications prior to visit.     ROS Review of Systems  Constitutional:  Negative for activity change, appetite change and fatigue.  HENT:  Negative for congestion, sinus pressure and sore throat.   Eyes:  Negative for visual disturbance.  Respiratory:  Negative for cough, chest tightness, shortness of breath and wheezing.   Cardiovascular:  Negative for chest pain and palpitations.  Gastrointestinal:  Negative for abdominal distention, abdominal pain and constipation.  Endocrine: Negative for polydipsia.  Genitourinary:  Negative for dysuria and frequency.  Musculoskeletal:  Negative for arthralgias and back pain.  Skin:  Negative for rash.  Neurological:  Negative for tremors, light-headedness and numbness.  Hematological:  Does not bruise/bleed easily.  Psychiatric/Behavioral:  Negative for agitation and behavioral problems.     Objective:  BP 109/73   Pulse 94   Temp 98.7 F (37.1 C) (  Oral)   Ht 5\' 5"  (1.651 m)   Wt 162 lb 9.6 oz (73.8 kg)   SpO2 98%   BMI 27.06 kg/m      10/13/2021   10:08 AM 09/09/2021   11:34 AM 04/17/2019    8:41 AM  BP/Weight  Systolic BP 109 167 130  Diastolic BP 73 94 85  Wt. (Lbs) 162.6 159.4 150.4  BMI 27.06 kg/m2 26.53 kg/m2 25.03 kg/m2      Physical Exam Exam conducted with a chaperone present.  Constitutional:      General: She is not in acute distress.    Appearance: She is well-developed. She is not diaphoretic.  HENT:     Head: Normocephalic.     Right Ear: External ear normal.     Left Ear: External ear normal.     Nose: Nose normal.  Eyes:      Conjunctiva/sclera: Conjunctivae normal.     Pupils: Pupils are equal, round, and reactive to light.  Neck:     Vascular: No JVD.  Cardiovascular:     Rate and Rhythm: Normal rate and regular rhythm.     Heart sounds: Normal heart sounds. No murmur heard.    No gallop.  Pulmonary:     Effort: Pulmonary effort is normal. No respiratory distress.     Breath sounds: Normal breath sounds. No wheezing or rales.  Chest:     Chest wall: No tenderness.  Breasts:    Right: Normal. No mass, nipple discharge or tenderness.     Left: Normal. No mass, nipple discharge or tenderness.  Abdominal:     General: Bowel sounds are normal. There is no distension.     Palpations: Abdomen is soft. There is no mass.     Tenderness: There is no abdominal tenderness.     Hernia: There is no hernia in the left inguinal area or right inguinal area.  Genitourinary:    General: Normal vulva.     Pubic Area: No rash.      Labia:        Right: No rash.        Left: No rash.      Vagina: Vaginal discharge (whitish cheesy discharge) present.     Cervix: Normal.     Uterus: Normal.      Adnexa: Right adnexa normal and left adnexa normal.       Right: No tenderness.         Left: No tenderness.    Musculoskeletal:        General: No tenderness. Normal range of motion.     Cervical back: Normal range of motion. No tenderness.  Lymphadenopathy:     Upper Body:     Right upper body: No supraclavicular or axillary adenopathy.     Left upper body: No supraclavicular or axillary adenopathy.  Skin:    General: Skin is warm and dry.  Neurological:     Mental Status: She is alert and oriented to person, place, and time.     Deep Tendon Reflexes: Reflexes are normal and symmetric.        Latest Ref Rng & Units 09/09/2021   12:07 PM 04/17/2019    9:33 AM 01/11/2019    9:49 PM  CMP  Glucose 70 - 99 mg/dL 01/13/2019  94  99   BUN 6 - 24 mg/dL 18  13  11    Creatinine 0.57 - 1.00 mg/dL 419   6.22   Sodium 134 -  144 mmol/L 141  139  137   Potassium 3.5 - 5.2 mmol/L 4.9  4.6  3.6   Chloride 96 - 106 mmol/L 102  101  102   CO2 20 - 29 mmol/L 21  23  24    Calcium 8.7 - 10.2 mg/dL 9.9  9.9  9.8   Total Protein 6.0 - 8.5 g/dL  7.1    Total Bilirubin 0.0 - 1.2 mg/dL  0.4    Alkaline Phos 39 - 117 IU/L  72    AST 0 - 40 IU/L  8    ALT 0 - 32 IU/L  10      Lipid Panel     Component Value Date/Time   CHOL 150 01/10/2019 0928   TRIG 72 01/10/2019 0928   HDL 74 01/10/2019 0928   CHOLHDL 2.0 01/10/2019 0928   LDLCALC 62 01/10/2019 0928    CBC    Component Value Date/Time   WBC 6.2 01/11/2019 2149   RBC 4.43 01/11/2019 2149   HGB 13.9 01/11/2019 2149   HCT 40.8 01/11/2019 2149   PLT 373 01/11/2019 2149   MCV 92.1 01/11/2019 2149   MCH 31.4 01/11/2019 2149   MCHC 34.1 01/11/2019 2149   RDW 13.5 01/11/2019 2149   LYMPHSABS 2.4 01/11/2019 2149   MONOABS 0.6 01/11/2019 2149   EOSABS 0.1 01/11/2019 2149   BASOSABS 0.0 01/11/2019 2149    Lab Results  Component Value Date   HGBA1C 6.0 09/09/2021    Assessment & Plan:  1. Annual physical exam Counseled on 150 minutes of exercise per week, healthy eating (including decreased daily intake of saturated fats, cholesterol, added sugars, sodium), routine healthcare maintenance.  - CBC with Differential/Platelet  2. Encounter for screening mammogram for malignant neoplasm of breast - MM DIGITAL SCREENING BILATERAL; Future  3. Screening for colon cancer - Fecal occult blood, imunochemical  4. Screening for cervical cancer - Cytology - PAP  5. Encounter for lipid screening for cardiovascular disease - LP+Non-HDL Cholesterol    No orders of the defined types were placed in this encounter.   Follow-up: Return in about 6 months (around 04/15/2022) for Chronic medical conditions.       04/17/2022, MD, FAAFP. [facility] [place], [place]   10/13/2021, 10:44 AM

## 2021-10-13 NOTE — Addendum Note (Signed)
Addended byHoy Register on: 10/13/2021 11:11 AM   Modules accepted: Orders

## 2021-10-14 LAB — CBC WITH DIFFERENTIAL/PLATELET
Basophils Absolute: 0.1 10*3/uL (ref 0.0–0.2)
Basos: 1 %
EOS (ABSOLUTE): 0.3 10*3/uL (ref 0.0–0.4)
Eos: 5 %
Hematocrit: 36.1 % (ref 34.0–46.6)
Hemoglobin: 12.4 g/dL (ref 11.1–15.9)
Immature Grans (Abs): 0 10*3/uL (ref 0.0–0.1)
Immature Granulocytes: 1 %
Lymphocytes Absolute: 2 10*3/uL (ref 0.7–3.1)
Lymphs: 32 %
MCH: 31.4 pg (ref 26.6–33.0)
MCHC: 34.3 g/dL (ref 31.5–35.7)
MCV: 91 fL (ref 79–97)
Monocytes Absolute: 0.7 10*3/uL (ref 0.1–0.9)
Monocytes: 10 %
Neutrophils Absolute: 3.4 10*3/uL (ref 1.4–7.0)
Neutrophils: 51 %
Platelets: 410 10*3/uL (ref 150–450)
RBC: 3.95 x10E6/uL (ref 3.77–5.28)
RDW: 12.9 % (ref 11.7–15.4)
WBC: 6.4 10*3/uL (ref 3.4–10.8)

## 2021-10-14 LAB — LP+NON-HDL CHOLESTEROL
Cholesterol, Total: 167 mg/dL (ref 100–199)
HDL: 62 mg/dL (ref >39)
LDL Chol Calc (NIH): 91 mg/dL (ref 0–99)
Total Non-HDL-Chol (LDL+VLDL): 105 mg/dL (ref 0–129)
Triglycerides: 72 mg/dL (ref 0–149)
VLDL Cholesterol Cal: 14 mg/dL (ref 5–40)

## 2021-10-20 LAB — CYTOLOGY - PAP
Comment: NEGATIVE
Diagnosis: NEGATIVE
Diagnosis: REACTIVE
High risk HPV: NEGATIVE

## 2021-11-06 LAB — FECAL OCCULT BLOOD, IMMUNOCHEMICAL: Fecal Occult Bld: NEGATIVE

## 2022-02-22 ENCOUNTER — Other Ambulatory Visit: Payer: Self-pay

## 2022-02-23 ENCOUNTER — Other Ambulatory Visit: Payer: Self-pay

## 2022-04-15 ENCOUNTER — Telehealth: Payer: Self-pay | Admitting: Family Medicine

## 2022-04-15 NOTE — Telephone Encounter (Signed)
Medication Refill - Medication: amLODipine (NORVASC) 10 MG tablet  losartan-hydrochlorothiazide (HYZAAR) 50-12.5 MG    Requesting 30 day supply because she cannot afford more at this time   Has the patient contacted their pharmacy? Yes.   (Agent: If no, request that the patient contact the pharmacy for the refill. If patient does not wish to contact the pharmacy document the reason why and proceed with request.) (Agent: If yes, when and what did the pharmacy advise?)  Preferred Pharmacy (with phone number or street name):  [facility] [address], [place] [address]  Phone: [phone] Fax: [phone]   Has the patient been seen for an appointment in the last year OR does the patient have an upcoming appointment? Yes.    Agent: Please be advised that RX refills may take up to 3 business days. We ask that you follow-up with your pharmacy.

## 2022-04-18 ENCOUNTER — Ambulatory Visit: Payer: Self-pay | Admitting: Family Medicine

## 2022-06-28 ENCOUNTER — Other Ambulatory Visit: Payer: Self-pay

## 2022-06-28 ENCOUNTER — Ambulatory Visit: Payer: Medicaid Other | Attending: Family Medicine | Admitting: Family Medicine

## 2022-06-28 VITALS — BP 153/93 | HR 80 | Temp 98.4°F | Ht 65.0 in | Wt 159.4 lb

## 2022-06-28 DIAGNOSIS — Z8249 Family history of ischemic heart disease and other diseases of the circulatory system: Secondary | ICD-10-CM | POA: Insufficient documentation

## 2022-06-28 DIAGNOSIS — Z79899 Other long term (current) drug therapy: Secondary | ICD-10-CM | POA: Insufficient documentation

## 2022-06-28 DIAGNOSIS — Z833 Family history of diabetes mellitus: Secondary | ICD-10-CM | POA: Insufficient documentation

## 2022-06-28 DIAGNOSIS — R7303 Prediabetes: Secondary | ICD-10-CM | POA: Insufficient documentation

## 2022-06-28 DIAGNOSIS — Z1211 Encounter for screening for malignant neoplasm of colon: Secondary | ICD-10-CM

## 2022-06-28 DIAGNOSIS — I10 Essential (primary) hypertension: Secondary | ICD-10-CM | POA: Insufficient documentation

## 2022-06-28 MED ORDER — LOSARTAN POTASSIUM-HCTZ 50-12.5 MG PO TABS
1.0000 | ORAL_TABLET | Freq: Every day | ORAL | 1 refills | Status: DC
  Filled 2022-06-28: qty 90, 90d supply, fill #0
  Filled 2022-12-26: qty 90, 90d supply, fill #1

## 2022-06-28 MED ORDER — AMLODIPINE BESYLATE 10 MG PO TABS
10.0000 mg | ORAL_TABLET | Freq: Every day | ORAL | 1 refills | Status: DC
  Filled 2022-06-28: qty 90, 90d supply, fill #0
  Filled 2022-12-26: qty 30, 30d supply, fill #1

## 2022-06-28 NOTE — Progress Notes (Signed)
Subjective:  Patient ID: Tina Hensley, female    DOB: 1965-12-24  Age: 57 y.o. MRN: [id number]  CC: Hypertension   HPI Kyelle Kirksey is a 57 y.o. year old female with a history of diabetes, hypertension.  Interval History:  She presents today for an office visit and has been without her medications for some time.  Last office visit was in 09/2021 and blood pressure is elevated today but was normal at previous office visits. 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 Denies presence of additional concerns today. Past Medical History:  Diagnosis Date   Hypertension     Past Surgical History:  Procedure Laterality Date   NO PAST SURGERIES      Family History  Problem Relation Age of Onset   Hypertension Mother    Diabetes Mother    Hypertension Father    Diabetes Father     Social History   Socioeconomic History   Marital status: Married    Spouse name: Not on file   Number of children: Not on file   Years of education: Not on file   Highest education level: Not on file  Occupational History   Not on file  Tobacco Use   Smoking status: Former    Packs/day: 0    Types: Cigarettes   Smokeless tobacco: Never  Vaping Use   Vaping Use: Never used  Substance and Sexual Activity   Alcohol use: Yes    Alcohol/week: 1.0 standard drink of alcohol    Types: 1 Cans of beer per week   Drug use: Not Currently    Types: Marijuana   Sexual activity: Not on file  Other Topics Concern   Not on file  Social History Narrative   Not on file   Social Determinants of Health   Financial Resource Strain: Not on file  Food Insecurity: Not on file  Transportation Needs: Not on file  Physical Activity: Not on file  Stress: Not on file  Social Connections: Not on file    No Known Allergies  Outpatient Medications Prior to Visit  Medication Sig Dispense Refill   predniSONE (DELTASONE) 20 MG tablet Take 1 tablet (20 mg total) by mouth daily with breakfast. 5  tablet 0   amLODipine (NORVASC) 10 MG tablet Take 1 tablet (10 mg total) by mouth daily. To lower blood pressure 90 tablet 1   losartan-hydrochlorothiazide (HYZAAR) 50-12.5 MG tablet Take 1 tablet by mouth daily. To lower blood pressure 90 tablet 1   carbamazepine (TEGRETOL) 200 MG tablet Take 1 tablet (200 mg total) by mouth 2 (two) times daily. 60 tablet 2   No facility-administered medications prior to visit.     ROS Review of Systems  Constitutional:  Negative for activity change and appetite change.  HENT:  Negative for sinus pressure and sore throat.   Respiratory:  Negative for chest tightness, shortness of breath and wheezing.   Cardiovascular:  Negative for chest pain and palpitations.  Gastrointestinal:  Negative for abdominal distention, abdominal pain and constipation.  Genitourinary: Negative.   Musculoskeletal: Negative.   Psychiatric/Behavioral:  Negative for behavioral problems and dysphoric mood.     Objective:  BP (!) 153/93   Pulse 80   Temp 98.4 F (36.9 C)   Ht 5\' 5"  (1.651 m)   Wt 159 lb 6.4 oz (72.3 kg)   SpO2 100%   BMI 26.53 kg/m      06/28/2022    3:11 PM 10/13/2021  10:08 AM 09/09/2021   11:34 AM  BP/Weight  Systolic BP 153 109 167  Diastolic BP 93 73 94  Wt. (Lbs) 159.4 162.6 159.4  BMI 26.53 kg/m2 27.06 kg/m2 26.53 kg/m2      Physical Exam Constitutional:      Appearance: She is well-developed.  Cardiovascular:     Rate and Rhythm: Normal rate.     Heart sounds: Normal heart sounds. No murmur heard. Pulmonary:     Effort: Pulmonary effort is normal.     Breath sounds: Normal breath sounds. No wheezing or rales.  Chest:     Chest wall: No tenderness.  Abdominal:     General: Bowel sounds are normal. There is no distension.     Palpations: Abdomen is soft. There is no mass.     Tenderness: There is no abdominal tenderness.  Musculoskeletal:        General: Normal range of motion.     Right lower leg: No edema.     Left lower leg:  No edema.  Neurological:     Mental Status: She is alert and oriented to person, place, and time.  Psychiatric:        Mood and Affect: Mood normal.        Latest Ref Rng & Units 09/09/2021   12:07 PM 04/17/2019    9:33 AM 01/11/2019    9:49 PM  CMP  Glucose 70 - 99 mg/dL 022  94  99   BUN 6 - 24 mg/dL 18  13  11    Creatinine 0.57 - 1.00 mg/dL 3.36  1.22  4.49   Sodium 134 - 144 mmol/L 141  139  137   Potassium 3.5 - 5.2 mmol/L 4.9  4.6  3.6   Chloride 96 - 106 mmol/L 102  101  102   CO2 20 - 29 mmol/L 21  23  24    Calcium 8.7 - 10.2 mg/dL 9.9  9.9  9.8   Total Protein 6.0 - 8.5 g/dL  7.1    Total Bilirubin 0.0 - 1.2 mg/dL  0.4    Alkaline Phos 39 - 117 IU/L  72    AST 0 - 40 IU/L  8    ALT 0 - 32 IU/L  10      Lipid Panel     Component Value Date/Time   CHOL 167 10/13/2021 1101   TRIG 72 10/13/2021 1101   HDL 62 10/13/2021 1101   CHOLHDL 2.0 01/10/2019 0928   LDLCALC 91 10/13/2021 1101    CBC    Component Value Date/Time   WBC 6.4 10/13/2021 1101   WBC 6.2 01/11/2019 2149   RBC 3.95 10/13/2021 1101   RBC 4.43 01/11/2019 2149   HGB 12.4 10/13/2021 1101   HCT 36.1 10/13/2021 1101   PLT 410 10/13/2021 1101   MCV 91 10/13/2021 1101   MCH 31.4 10/13/2021 1101   MCH 31.4 01/11/2019 2149   MCHC 34.3 10/13/2021 1101   MCHC 34.1 01/11/2019 2149   RDW 12.9 10/13/2021 1101   LYMPHSABS 2.0 10/13/2021 1101   MONOABS 0.6 01/11/2019 2149   EOSABS 0.3 10/13/2021 1101   BASOSABS 0.1 10/13/2021 1101    Lab Results  Component Value Date   HGBA1C 6.0 09/09/2021    Assessment & Plan:  1. Essential hypertension Uncontrolled due to running out of antihypertensive which I have refilled Counseled on blood pressure goal of less than 130/80, low-sodium, DASH diet, medication compliance, 150 minutes of moderate intensity exercise per  week. Discussed medication compliance, adverse effects. - amLODipine (NORVASC) 10 MG tablet; Take 1 tablet (10 mg total) by mouth daily. To  lower blood pressure  Dispense: 90 tablet; Refill: 1 - losartan-hydrochlorothiazide (HYZAAR) 50-12.5 MG tablet; Take 1 tablet by mouth daily. To lower blood pressure  Dispense: 90 tablet; Refill: 1 - Basic Metabolic Panel  2. Prediabetes Labs reveal prediabetes with an A1c of 6.0.  Working on a low carbohydrate diet, exercise, weight loss is recommended in order to prevent progression to type 2 diabetes mellitus.  - Hemoglobin A1c  3. Screening for colon cancer - Ambulatory referral to Gastroenterology   Meds ordered this encounter  Medications   amLODipine (NORVASC) 10 MG tablet    Sig: Take 1 tablet (10 mg total) by mouth daily. To lower blood pressure    Dispense:  90 tablet    Refill:  1   losartan-hydrochlorothiazide (HYZAAR) 50-12.5 MG tablet    Sig: Take 1 tablet by mouth daily. To lower blood pressure    Dispense:  90 tablet    Refill:  1    Follow-up: Return in about 6 months (around 12/28/2022) for Chronic medical conditions.       Hoy RegisterEnobong Zoiey Christy, MD, FAAFP. [facility] [place] , [place] [phone]   06/28/2022, 3:47 PM

## 2022-06-28 NOTE — Patient Instructions (Signed)
Managing Your Hypertension Hypertension, also called high blood pressure, is when the force of the blood pressing against the walls of the arteries is too strong. Arteries are blood vessels that carry blood from your heart throughout your body. Hypertension forces the heart to work harder to pump blood and may cause the arteries to become narrow or stiff. Understanding blood pressure readings A blood pressure reading includes a higher number over a lower number: The first, or top, number is called the systolic pressure. It is a measure of the pressure in your arteries as your heart beats. The second, or bottom number, is called the diastolic pressure. It is a measure of the pressure in your arteries as the heart relaxes. For most people, a normal blood pressure is below 120/80. Your personal target blood pressure may vary depending on your medical conditions, your age, and other factors. Blood pressure is classified into four stages. Based on your blood pressure reading, your health care provider may use the following stages to determine what type of treatment you need, if any. Systolic pressure and diastolic pressure are measured in a unit called millimeters of mercury (mmHg). Normal Systolic pressure: below 120. Diastolic pressure: below 80. Elevated Systolic pressure: 120-129. Diastolic pressure: below 80. Hypertension stage 1 Systolic pressure: 130-139. Diastolic pressure: 80-89. Hypertension stage 2 Systolic pressure: 140 or above. Diastolic pressure: 90 or above. How can this condition affect me? Managing your hypertension is very important. Over time, hypertension can damage the arteries and decrease blood flow to parts of the body, including the brain, heart, and kidneys. Having untreated or uncontrolled hypertension can lead to: A heart attack. A stroke. A weakened blood vessel (aneurysm). Heart failure. Kidney damage. Eye damage. Memory and concentration problems. Vascular  dementia. What actions can I take to manage this condition? Hypertension can be managed by making lifestyle changes and possibly by taking medicines. Your health care provider will help you make a plan to bring your blood pressure within a normal range. You may be referred for counseling on a healthy diet and physical activity. Nutrition  Eat a diet that is high in fiber and potassium, and low in salt (sodium), added sugar, and fat. An example eating plan is called the DASH diet. DASH stands for Dietary Approaches to Stop Hypertension. To eat this way: Eat plenty of fresh fruits and vegetables. Try to fill one-half of your plate at each meal with fruits and vegetables. Eat whole grains, such as whole-wheat pasta, brown rice, or whole-grain bread. Fill about one-fourth of your plate with whole grains. Eat low-fat dairy products. Avoid fatty cuts of meat, processed or cured meats, and poultry with skin. Fill about one-fourth of your plate with lean proteins such as fish, chicken without skin, beans, eggs, and tofu. Avoid pre-made and processed foods. These tend to be higher in sodium, added sugar, and fat. Reduce your daily sodium intake. Many people with hypertension should eat less than 1,500 mg of sodium a day. Lifestyle  Work with your health care provider to maintain a healthy body weight or to lose weight. Ask what an ideal weight is for you. Get at least 30 minutes of exercise that causes your heart to beat faster (aerobic exercise) most days of the week. Activities may include walking, swimming, or biking. Include exercise to strengthen your muscles (resistance exercise), such as weight lifting, as part of your weekly exercise routine. Try to do these types of exercises for 30 minutes at least 3 days a week. Do   not use any products that contain nicotine or tobacco. These products include cigarettes, chewing tobacco, and vaping devices, such as e-cigarettes. If you need help quitting, ask your  health care provider. Control any long-term (chronic) conditions you have, such as high cholesterol or diabetes. Identify your sources of stress and find ways to manage stress. This may include meditation, deep breathing, or making time for fun activities. Alcohol use Do not drink alcohol if: Your health care provider tells you not to drink. You are pregnant, may be pregnant, or are planning to become pregnant. If you drink alcohol: Limit how much you have to: 0-1 drink a day for women. 0-2 drinks a day for men. Know how much alcohol is in your drink. In the [place] one drink equals one 12 oz bottle of beer (355 mL), one 5 oz glass of wine (148 mL), or one 1 oz glass of hard liquor (44 mL). Medicines Your health care provider may prescribe medicine if lifestyle changes are not enough to get your blood pressure under control and if: Your systolic blood pressure is 130 or higher. Your diastolic blood pressure is 80 or higher. Take medicines only as told by your health care provider. Follow the directions carefully. Blood pressure medicines must be taken as told by your health care provider. The medicine does not work as well when you skip doses. Skipping doses also puts you at risk for problems. Monitoring Before you monitor your blood pressure: Do not smoke, drink caffeinated beverages, or exercise within 30 minutes before taking a measurement. Use the bathroom and empty your bladder (urinate). Sit quietly for at least 5 minutes before taking measurements. Monitor your blood pressure at home as told by your health care provider. To do this: Sit with your back straight and supported. Place your feet flat on the floor. Do not cross your legs. Support your arm on a flat surface, such as a table. Make sure your upper arm is at heart level. Each time you measure, take two or three readings one minute apart and record the results. You may also need to have your blood pressure checked regularly by  your health care provider. General information Talk with your health care provider about your diet, exercise habits, and other lifestyle factors that may be contributing to hypertension. Review all the medicines you take with your health care provider because there may be side effects or interactions. Keep all follow-up visits. Your health care provider can help you create and adjust your plan for managing your high blood pressure. Where to find more information National Heart, Lung, and Blood Institute: www.nhlbi.nih.gov American Heart Association: www.heart.org Contact a health care provider if: You think you are having a reaction to medicines you have taken. You have repeated (recurrent) headaches. You feel dizzy. You have swelling in your ankles. You have trouble with your vision. Get help right away if: You develop a severe headache or confusion. You have unusual weakness or numbness, or you feel faint. You have severe pain in your chest or abdomen. You vomit repeatedly. You have trouble breathing. These symptoms may be an emergency. Get help right away. Call 911. Do not wait to see if the symptoms will go away. Do not drive yourself to the hospital. Summary Hypertension is when the force of blood pumping through your arteries is too strong. If this condition is not controlled, it may put you at risk for serious complications. Your personal target blood pressure may vary depending on your medical conditions,   your age, and other factors. For most people, a normal blood pressure is less than 120/80. Hypertension is managed by lifestyle changes, medicines, or both. Lifestyle changes to help manage hypertension include losing weight, eating a healthy, low-sodium diet, exercising more, stopping smoking, and limiting alcohol. This information is not intended to replace advice given to you by your health care provider. Make sure you discuss any questions you have with your health care  provider. Document Revised: 11/19/2020 Document Reviewed: 11/19/2020 Elsevier Patient Education  2023 Elsevier Inc.  

## 2022-06-29 LAB — BASIC METABOLIC PANEL
BUN/Creatinine Ratio: 17 (ref 9–23)
BUN: 13 mg/dL (ref 6–24)
CO2: 22 mmol/L (ref 20–29)
Calcium: 9.3 mg/dL (ref 8.7–10.2)
Chloride: 107 mmol/L — ABNORMAL HIGH (ref 96–106)
Creatinine, Ser: 0.76 mg/dL (ref 0.57–1.00)
Glucose: 96 mg/dL (ref 70–99)
Potassium: 4.4 mmol/L (ref 3.5–5.2)
Sodium: 144 mmol/L (ref 134–144)
eGFR: 91 mL/min/{1.73_m2} (ref >59)

## 2022-06-29 LAB — HEMOGLOBIN A1C
Est. average glucose Bld gHb Est-mCnc: 131 mg/dL
Hgb A1c MFr Bld: 6.2 % — ABNORMAL HIGH (ref 4.8–5.6)

## 2022-07-23 ENCOUNTER — Ambulatory Visit (HOSPITAL_COMMUNITY)
Admission: EM | Admit: 2022-07-23 | Discharge: 2022-07-23 | Disposition: A | Discharge status: Home / Self Care | Payer: Self-pay | Attending: Physician Assistant | Admitting: Physician Assistant

## 2022-07-23 ENCOUNTER — Encounter (HOSPITAL_COMMUNITY): Payer: Self-pay

## 2022-07-23 ENCOUNTER — Telehealth (HOSPITAL_COMMUNITY): Payer: Self-pay

## 2022-07-23 DIAGNOSIS — R051 Acute cough: Secondary | ICD-10-CM

## 2022-07-23 DIAGNOSIS — I10 Essential (primary) hypertension: Secondary | ICD-10-CM

## 2022-07-23 DIAGNOSIS — R112 Nausea with vomiting, unspecified: Secondary | ICD-10-CM

## 2022-07-23 MED ORDER — ONDANSETRON HCL 4 MG PO TABS
4.0000 mg | ORAL_TABLET | Freq: Three times a day (TID) | ORAL | 0 refills | Status: DC | PRN
  Filled 2022-07-23: qty 20, 7d supply, fill #0

## 2022-07-23 MED ORDER — BENZONATATE 100 MG PO CAPS
100.0000 mg | ORAL_CAPSULE | Freq: Three times a day (TID) | ORAL | 0 refills | Status: DC | PRN
  Filled 2022-07-23: qty 20, 7d supply, fill #0

## 2022-07-23 MED ORDER — ONDANSETRON HCL 4 MG PO TABS
4.0000 mg | ORAL_TABLET | Freq: Three times a day (TID) | ORAL | 0 refills | Status: DC | PRN
Start: 2022-07-23 — End: 2023-10-26

## 2022-07-23 MED ORDER — BENZONATATE 100 MG PO CAPS
100.0000 mg | ORAL_CAPSULE | Freq: Three times a day (TID) | ORAL | 0 refills | Status: DC | PRN
Start: 2022-07-23 — End: 2023-10-26

## 2022-07-23 NOTE — ED Triage Notes (Signed)
Pt states that she's had a cough for a while. NVD. Started Tuesday. She's taken cough syrup for her cough. She hasn't taken he bp meds since Monday. No fever.

## 2022-07-23 NOTE — ED Provider Notes (Signed)
[facility]   MRN: [id number] DOB: Apr 10, 1965  Subjective:   Tina Hensley is a 57 y.o. female presenting for cough symptoms for nearly last week.  States that she was around her grandkids, who had similar symptoms.  She has been taking over-the-counter Robitussin with minimal relief.  She has also been experiencing some nausea, vomiting, diarrhea.  She does seem to be vomiting after some coughing spells.  She has not been able to take her blood pressure medicine this week because of her nausea, and she was nervous about throwing them back up.  Denies any headache.  No fever or chills.  No abdominal pain.  No other symptoms to report today.  No current facility-administered medications for this encounter.  Current Outpatient Medications:    amLODipine (NORVASC) 10 MG tablet, Take 1 tablet (10 mg total) by mouth daily. To lower blood pressure, Disp: 90 tablet, Rfl: 1   losartan-hydrochlorothiazide (HYZAAR) 50-12.5 MG tablet, Take 1 tablet by mouth daily. To lower blood pressure, Disp: 90 tablet, Rfl: 1   benzonatate (TESSALON PERLES) 100 MG capsule, Take 1 capsule (100 mg total) by mouth 3 (three) times daily as needed for cough., Disp: 20 capsule, Rfl: 0   ondansetron (ZOFRAN) 4 MG tablet, Take 1 tablet (4 mg total) by mouth every 8 (eight) hours as needed for nausea or vomiting., Disp: 20 tablet, Rfl: 0   No Known Allergies  Past Medical History:  Diagnosis Date   Hypertension      Past Surgical History:  Procedure Laterality Date   NO PAST SURGERIES      Family History  Problem Relation Age of Onset   Hypertension Mother    Diabetes Mother    Hypertension Father    Diabetes Father     Social History   Tobacco Use   Smoking status: Former    Packs/day: 0    Types: Cigarettes   Smokeless tobacco: Never  Vaping Use   Vaping Use: Never used  Substance Use Topics   Alcohol use: Yes    Alcohol/week: 1.0 standard drink of alcohol    Types: 1 Cans of beer  per week   Drug use: Not Currently    Types: Marijuana    ROS REFER TO HPI FOR PERTINENT POSITIVES AND NEGATIVES   Objective:   Vitals: BP (!) 187/107 (BP Location: Left Arm)   Pulse 72   Temp 97.9 F (36.6 C) (Oral)   Resp 18   Wt 155 lb (70.3 kg)   SpO2 97%   BMI 25.79 kg/m   Physical Exam Vitals and nursing note reviewed.  Constitutional:      General: She is not in acute distress.    Appearance: Normal appearance. She is not ill-appearing.  HENT:     Head: Normocephalic.     Right Ear: Tympanic membrane, ear canal and external ear normal.     Left Ear: Tympanic membrane, ear canal and external ear normal.     Nose: No congestion or rhinorrhea.     Mouth/Throat:     Mouth: Mucous membranes are moist.     Pharynx: No oropharyngeal exudate or posterior oropharyngeal erythema.  Eyes:     Extraocular Movements: Extraocular movements intact.     Conjunctiva/sclera: Conjunctivae normal.     Pupils: Pupils are equal, round, and reactive to light.  Cardiovascular:     Rate and Rhythm: Normal rate and regular rhythm.     Pulses: Normal pulses.  Heart sounds: Normal heart sounds. No murmur heard. Pulmonary:     Effort: Pulmonary effort is normal. No respiratory distress.     Breath sounds: Normal breath sounds. No wheezing or rhonchi.  Musculoskeletal:     Cervical back: Normal range of motion.  Skin:    General: Skin is warm.  Neurological:     Mental Status: She is alert and oriented to person, place, and time.  Psychiatric:        Mood and Affect: Mood normal.        Behavior: Behavior normal.     No results found for this or any previous visit (from the past 24 hour(s)).  Assessment and Plan :   PDMP not reviewed this encounter.  1. Acute cough   2. Nausea and vomiting, unspecified vomiting type   3. Essential hypertension    Exam overall benign and reassuring.  Lungs CTAB today.  Symptoms consistent with viral infection, no evidence of bacterial  infection that would require antibiotics.  Patient informed of this.  Treat conservatively at this time with Tessalon Perles, Zofran, over-the-counter Mucinex, fluids as directed.  Blood pressure noted to be elevated.  This is consistent with her history of not taking her amlodipine and Hyzaar this week.  I encouraged her that the Zofran should help with her nausea and she should be able to resume taking her blood pressure medication as directed.  She will continue to monitor at home and follow-up with her primary care if needed.  No signs of endorgan damage.  Return precautions discussed.  Patient agreeable and understanding to plan.   AllwardtCrist Infante, PA-C 07/23/22 1201

## 2022-07-23 NOTE — Discharge Instructions (Addendum)
Good to meet you today.  Thankfully your lung exam is clear.  Symptoms seem consistent with a viral infection.  Please take the cough tablets and Zofran as directed.  You can use Mucinex over-the-counter to help with your productive cough.  Keep well-hydrated to help thin out any mucus.  Recheck with [name] or your primary care if worse or no improvement of symptoms.  Please feel better soon!  Take your blood pressure medications as directed.

## 2022-07-25 ENCOUNTER — Other Ambulatory Visit: Payer: Self-pay

## 2022-07-25 NOTE — Telephone Encounter (Signed)
error 

## 2022-12-26 ENCOUNTER — Other Ambulatory Visit: Payer: Self-pay

## 2022-12-27 ENCOUNTER — Other Ambulatory Visit: Payer: Self-pay

## 2022-12-28 ENCOUNTER — Other Ambulatory Visit: Payer: Self-pay

## 2022-12-28 ENCOUNTER — Ambulatory Visit: Payer: 59 | Admitting: Family Medicine

## 2023-01-19 ENCOUNTER — Ambulatory Visit: Payer: 59 | Admitting: Physician Assistant

## 2023-04-19 ENCOUNTER — Other Ambulatory Visit: Payer: Self-pay

## 2023-04-19 ENCOUNTER — Ambulatory Visit: Payer: 59 | Attending: Physician Assistant | Admitting: Physician Assistant

## 2023-04-19 VITALS — BP 159/95 | HR 76 | Temp 98.4°F | Ht 65.0 in | Wt 158.0 lb

## 2023-04-19 DIAGNOSIS — I10 Essential (primary) hypertension: Secondary | ICD-10-CM | POA: Diagnosis not present

## 2023-04-19 DIAGNOSIS — R58 Hemorrhage, not elsewhere classified: Secondary | ICD-10-CM | POA: Diagnosis not present

## 2023-04-19 DIAGNOSIS — R7303 Prediabetes: Secondary | ICD-10-CM

## 2023-04-19 LAB — POCT GLYCOSYLATED HEMOGLOBIN (HGB A1C): Hemoglobin A1C: 5.9 % — AB (ref 4.0–5.6)

## 2023-04-19 MED ORDER — AMLODIPINE BESYLATE 10 MG PO TABS
10.0000 mg | ORAL_TABLET | Freq: Every day | ORAL | 1 refills | Status: DC
  Filled 2023-04-19: qty 30, 30d supply, fill #0
  Filled 2023-05-17: qty 30, 30d supply, fill #1
  Filled 2023-06-23: qty 30, 30d supply, fill #2
  Filled 2023-07-26: qty 30, 30d supply, fill #3

## 2023-04-19 MED ORDER — LOSARTAN POTASSIUM-HCTZ 50-12.5 MG PO TABS
1.0000 | ORAL_TABLET | Freq: Every day | ORAL | 1 refills | Status: DC
  Filled 2023-04-19: qty 30, 30d supply, fill #0
  Filled 2023-05-17: qty 30, 30d supply, fill #1
  Filled 2023-06-23: qty 30, 30d supply, fill #2
  Filled 2023-07-26: qty 30, 30d supply, fill #3

## 2023-04-19 NOTE — Progress Notes (Signed)
Patient ID: Tina Hensley, female   DOB: November 11, 1965, 58 y.o.   MRN: [id number]   Tina Hensley, is a 57 y.o. female  [id number]  [id number]  DOB - 11-30-1965  Chief Complaint  Patient presents with   Hypertension       Subjective:   Tina Hensley is a 58 y.o. female here today for BP med RF.  Out of meds for about 2 months.  BP was controlled OOO when on meds. About 3 weeks ago she had bright red blood in the toilet.  She has been taking beets for her BP but she feels this was blood.  She has not had colonoscopy.  Last had screening(heme) 10/2021.  No diarrhea or constipation.  Has not noticed other bleeding.  No abdominal pain and no urinary s/sx.    No problems updated.  ALLERGIES: No Known Allergies  PAST MEDICAL HISTORY: Past Medical History:  Diagnosis Date   Hypertension     MEDICATIONS AT HOME: Prior to Admission medications   Medication Sig Start Date End Date Taking? Authorizing Provider  amLODipine (NORVASC) 10 MG tablet Take 1 tablet (10 mg total) by mouth daily. To lower blood pressure 04/19/23   Javarious Elsayed M, PA-C  benzonatate (TESSALON PERLES) 100 MG capsule Take 1 capsule (100 mg total) by mouth 3 (three) times daily as needed for cough. 07/23/22   Allwardt, Crist Infante, PA-C  losartan-hydrochlorothiazide (HYZAAR) 50-12.5 MG tablet Take 1 tablet by mouth daily. To lower blood pressure 04/19/23   Amunique Neyra, Marylene Land M, PA-C  ondansetron (ZOFRAN) 4 MG tablet Take 1 tablet (4 mg total) by mouth every 8 (eight) hours as needed for nausea or vomiting. 07/23/22   Allwardt, Alyssa M, PA-C    ROS: Neg HEENT Neg resp Neg cardiac Neg GU Neg MS Neg psych Neg neuro  Objective:   Vitals:   04/19/23 0855 04/19/23 0918  BP: (!) 158/102 (!) 159/95  Pulse: 76   Temp: 98.4 F (36.9 C)   SpO2: 97%   Weight: 158 lb (71.7 kg)   Height: 5\' 5"  (1.651 m)    Exam General appearance : Awake, alert, not in any distress. Speech Clear. Not toxic looking HEENT: Atraumatic and  Normocephalic Neck: Supple, no JVD. No cervical lymphadenopathy.  Chest: Good air entry bilaterally, CTAB.  No rales/rhonchi/wheezing CVS: S1 S2 regular, no murmurs.  Extremities: B/L Lower Ext shows no edema, both legs are warm to touch Neurology: Awake alert, and oriented X 3, CN II-XII intact, Non focal Skin: No Rash  Data Review Lab Results  Component Value Date   HGBA1C 5.9 (A) 04/19/2023   HGBA1C 6.2 (H) 06/28/2022   HGBA1C 6.0 09/09/2021    Assessment & Plan   1. Prediabetes (Primary) Improved.   - CMP14+EGFR  2. Essential hypertension Off meds.  Resume meds at 1/2 dose for 1 week.  Drink 64 to 80 ounces water daily.  Resume full dose in 1 week.  Check BP OOO.   - amLODipine (NORVASC) 10 MG tablet; Take 1 tablet (10 mg total) by mouth daily. To lower blood pressure  Dispense: 90 tablet; Refill: 1 - losartan-hydrochlorothiazide (HYZAAR) 50-12.5 MG tablet; Take 1 tablet by mouth daily. To lower blood pressure  Dispense: 90 tablet; Refill: 1 - CMP14+EGFR  3. Blood in toilet bowl - CBC with Differential - Ambulatory referral to Gastroenterology    Return in about 4 months (around 08/17/2023) for PCP for chronic conditions-[place].  The patient was given clear instructions to go to ER or  return to medical center if symptoms don't improve, worsen or new problems develop. The patient verbalized understanding. The patient was told to call to get lab results if they haven't heard anything in the next week.      Georgian Co, PA-C [facility] and [facility] [place], [place] [phone]   04/19/2023, 9:30 AM

## 2023-04-20 ENCOUNTER — Telehealth: Payer: Self-pay

## 2023-04-20 ENCOUNTER — Other Ambulatory Visit: Payer: Self-pay | Admitting: Physician Assistant

## 2023-04-20 DIAGNOSIS — E875 Hyperkalemia: Secondary | ICD-10-CM

## 2023-04-20 LAB — CMP14+EGFR
ALT: 35 [IU]/L — ABNORMAL HIGH (ref 0–32)
AST: 28 [IU]/L (ref 0–40)
Albumin: 4.7 g/dL (ref 3.8–4.9)
Alkaline Phosphatase: 90 [IU]/L (ref 44–121)
BUN/Creatinine Ratio: 18 (ref 9–23)
BUN: 16 mg/dL (ref 6–24)
Bilirubin Total: 0.8 mg/dL (ref 0.0–1.2)
CO2: 21 mmol/L (ref 20–29)
Calcium: 10.1 mg/dL (ref 8.7–10.2)
Chloride: 103 mmol/L (ref 96–106)
Creatinine, Ser: 0.88 mg/dL (ref 0.57–1.00)
Globulin, Total: 2.9 g/dL (ref 1.5–4.5)
Glucose: 110 mg/dL — ABNORMAL HIGH (ref 70–99)
Potassium: 5.4 mmol/L — ABNORMAL HIGH (ref 3.5–5.2)
Sodium: 141 mmol/L (ref 134–144)
Total Protein: 7.6 g/dL (ref 6.0–8.5)
eGFR: 76 mL/min/{1.73_m2} (ref >59)

## 2023-04-20 LAB — CBC WITH DIFFERENTIAL/PLATELET
Basophils Absolute: 0.1 10*3/uL (ref 0.0–0.2)
Basos: 1 %
EOS (ABSOLUTE): 0.4 10*3/uL (ref 0.0–0.4)
Eos: 6 %
Hematocrit: 42.3 % (ref 34.0–46.6)
Hemoglobin: 13.8 g/dL (ref 11.1–15.9)
Immature Grans (Abs): 0 10*3/uL (ref 0.0–0.1)
Immature Granulocytes: 0 %
Lymphocytes Absolute: 2.5 10*3/uL (ref 0.7–3.1)
Lymphs: 37 %
MCH: 30.9 pg (ref 26.6–33.0)
MCHC: 32.6 g/dL (ref 31.5–35.7)
MCV: 95 fL (ref 79–97)
Monocytes Absolute: 0.6 10*3/uL (ref 0.1–0.9)
Monocytes: 9 %
Neutrophils Absolute: 3.1 10*3/uL (ref 1.4–7.0)
Neutrophils: 47 %
Platelets: 395 10*3/uL (ref 150–450)
RBC: 4.46 x10E6/uL (ref 3.77–5.28)
RDW: 12.6 % (ref 11.7–15.4)
WBC: 6.8 10*3/uL (ref 3.4–10.8)

## 2023-04-20 NOTE — Telephone Encounter (Signed)
-----   Message from Georgian Co sent at 04/20/2023  8:38 AM EST ----- Please call patient.  Her potassium is a little elevated but should improve as she resumes her blood pressure medications.  Remind her to drink 64 to 80 ounces water daily.  Come in 3-4 weeks for recheck blood work(lab order is in).  Blood count is normal.  Thanks, Georgian Co, PA-C

## 2023-04-20 NOTE — Telephone Encounter (Signed)
Pt was called and vm was left, Information has been sent to nurse pool.

## 2023-04-21 ENCOUNTER — Telehealth: Payer: Self-pay

## 2023-04-21 NOTE — Telephone Encounter (Signed)
Called patient no answer/ unable to leave voicemail   Letter sent to home

## 2023-04-21 NOTE — Telephone Encounter (Signed)
-----   Message from Georgian Co sent at 04/20/2023  8:38 AM EST ----- Please call patient.  Her potassium is a little elevated but should improve as she resumes her blood pressure medications.  Remind her to drink 64 to 80 ounces water daily.  Come in 3-4 weeks for recheck blood work(lab order is in).  Blood count is normal.  Thanks, Georgian Co, PA-C

## 2023-05-17 ENCOUNTER — Other Ambulatory Visit: Payer: Self-pay

## 2023-05-17 ENCOUNTER — Ambulatory Visit: Payer: 59 | Attending: Family Medicine

## 2023-05-17 DIAGNOSIS — E875 Hyperkalemia: Secondary | ICD-10-CM

## 2023-05-18 ENCOUNTER — Telehealth (INDEPENDENT_AMBULATORY_CARE_PROVIDER_SITE_OTHER): Payer: Self-pay

## 2023-05-18 LAB — BASIC METABOLIC PANEL
BUN/Creatinine Ratio: 16 (ref 9–23)
BUN: 14 mg/dL (ref 6–24)
CO2: 22 mmol/L (ref 20–29)
Calcium: 9.8 mg/dL (ref 8.7–10.2)
Chloride: 104 mmol/L (ref 96–106)
Creatinine, Ser: 0.89 mg/dL (ref 0.57–1.00)
Glucose: 122 mg/dL — ABNORMAL HIGH (ref 70–99)
Potassium: 4.9 mmol/L (ref 3.5–5.2)
Sodium: 142 mmol/L (ref 134–144)
eGFR: 75 mL/min/{1.73_m2} (ref >59)

## 2023-05-18 NOTE — Telephone Encounter (Signed)
-----   Message from Georgian Co sent at 05/18/2023  8:25 AM EST ----- Please call and let her know her kidney function and electrolytes are normal.  Thanks, Georgian Co, PA-C

## 2023-05-18 NOTE — Telephone Encounter (Signed)
 Pt called back and verbalized understanding of note from [place]

## 2023-05-18 NOTE — Telephone Encounter (Signed)
 Pt was called and vm was left, Information has been sent to nurse pool.

## 2023-06-23 ENCOUNTER — Other Ambulatory Visit: Payer: Self-pay

## 2023-06-26 ENCOUNTER — Other Ambulatory Visit: Payer: Self-pay

## 2023-07-26 ENCOUNTER — Other Ambulatory Visit: Payer: Self-pay

## 2023-08-17 ENCOUNTER — Ambulatory Visit: Payer: 59 | Admitting: Family Medicine

## 2023-08-22 ENCOUNTER — Ambulatory Visit: Admitting: Family Medicine

## 2023-09-20 ENCOUNTER — Telehealth: Payer: Self-pay | Admitting: Family Medicine

## 2023-09-20 NOTE — Telephone Encounter (Signed)
Contacted pt confirmed appt

## 2023-09-21 ENCOUNTER — Ambulatory Visit: Admitting: Family Medicine

## 2023-10-25 ENCOUNTER — Telehealth: Payer: Self-pay | Admitting: Family Medicine

## 2023-10-25 NOTE — Telephone Encounter (Signed)
 pT unconfirmed appt lvm 8/6

## 2023-10-26 ENCOUNTER — Ambulatory Visit: Payer: Self-pay | Attending: Family Medicine | Admitting: Family Medicine

## 2023-10-26 ENCOUNTER — Other Ambulatory Visit: Payer: Self-pay

## 2023-10-26 ENCOUNTER — Encounter: Payer: Self-pay | Admitting: Family Medicine

## 2023-10-26 VITALS — BP 145/88 | HR 84 | Ht 65.0 in | Wt 170.2 lb

## 2023-10-26 DIAGNOSIS — M62838 Other muscle spasm: Secondary | ICD-10-CM

## 2023-10-26 DIAGNOSIS — R7303 Prediabetes: Secondary | ICD-10-CM

## 2023-10-26 DIAGNOSIS — I10 Essential (primary) hypertension: Secondary | ICD-10-CM

## 2023-10-26 DIAGNOSIS — Z23 Encounter for immunization: Secondary | ICD-10-CM

## 2023-10-26 DIAGNOSIS — Z1231 Encounter for screening mammogram for malignant neoplasm of breast: Secondary | ICD-10-CM

## 2023-10-26 DIAGNOSIS — Z1211 Encounter for screening for malignant neoplasm of colon: Secondary | ICD-10-CM

## 2023-10-26 LAB — POCT GLYCOSYLATED HEMOGLOBIN (HGB A1C): HbA1c, POC (prediabetic range): 5.8 % (ref 5.7–6.4)

## 2023-10-26 MED ORDER — LOSARTAN POTASSIUM-HCTZ 100-25 MG PO TABS
1.0000 | ORAL_TABLET | Freq: Every day | ORAL | 1 refills | Status: AC
  Filled 2023-10-26: qty 90, 90d supply, fill #0

## 2023-10-26 MED ORDER — AMLODIPINE BESYLATE 10 MG PO TABS
10.0000 mg | ORAL_TABLET | Freq: Every day | ORAL | 1 refills | Status: AC
  Filled 2023-10-26: qty 90, 90d supply, fill #0

## 2023-10-26 MED ORDER — TIZANIDINE HCL 4 MG PO TABS
4.0000 mg | ORAL_TABLET | Freq: Three times a day (TID) | ORAL | 1 refills | Status: AC | PRN
  Filled 2023-10-26: qty 60, 20d supply, fill #0

## 2023-10-26 NOTE — Patient Instructions (Signed)
 VISIT SUMMARY:  During your visit, we discussed your elevated blood pressure, left arm pain, and prediabetes. We reviewed your current medications and made some adjustments to better manage your conditions. We also discussed lifestyle changes and ordered some tests to monitor your health.  YOUR PLAN:  -ESSENTIAL HYPERTENSION: Essential hypertension means your blood pressure is consistently high. We are increasing your losartan -hydrochlorothiazide  dose to 100-25 mg daily to help control it. Additionally, you should follow a low sodium diet and engage in regular exercise. We will check your blood pressure again in 3 months.  -PREDIABETES: Prediabetes means your blood sugar levels are higher than normal but not high enough to be classified as diabetes. We have ordered an A1c test to reassess your condition.  -LEFT AXILLARY AND LATERAL CHEST WALL MUSCLE SPASM: The pain under your left arm is likely due to muscle spasms. We have prescribed a muscle relaxer to help manage the pain. You should also use warm compresses and massage the area. Performing exercises like twisting and reaching can help stretch the muscle and alleviate the pain.  INSTRUCTIONS:  Please follow up in 3 months for a blood pressure evaluation. Make sure to get your A1c test done as ordered.

## 2023-10-26 NOTE — Progress Notes (Signed)
 Subjective:  Patient ID: Tina Hensley, female    DOB: [birth year]  Age: 58 y.o. MRN: [id number]  CC: Medical Management of Chronic Issues (Pain under left arm)     Discussed the use of AI scribe software for clinical note transcription with the patient, who gave verbal consent to proceed.  History of Present Illness Tina Hensley is a 58 year old female with hypertension, prediabetes who presents with elevated blood pressure and left arm pain.  Her blood pressure remains elevated despite adherence to amlodipine  and losartan /hydrochlorothiazide , with consistent high readings, including during a January visit. She has five or six doses left and took them this morning.  She experiences sporadic pain under her left arm, described as a muscle spasm, triggered by activities like getting out of bed or washing dishes. The pain is localized to the left underarm and does not radiate.  She has not eaten breakfast today, Her last lab work was in February, and she was noted to have prediabetes in January and A1c 5.9, prompting a repeat A1c test today which returned at 5.8. She is currently seeking part-time employment and lacks medical coverage.    Past Medical History:  Diagnosis Date   Hypertension     Past Surgical History:  Procedure Laterality Date   NO PAST SURGERIES      Family History  Problem Relation Age of Onset   Hypertension Mother    Diabetes Mother    Hypertension Father    Diabetes Father     Social History   Socioeconomic History   Marital status: Married    Spouse name: Not on file   Number of children: Not on file   Years of education: Not on file   Highest education level: Not on file  Occupational History   Not on file  Tobacco Use   Smoking status: Former    Types: Cigarettes   Smokeless tobacco: Never  Vaping Use   Vaping status: Never Used  Substance and Sexual Activity   Alcohol use: Yes    Alcohol/week: 1.0 standard drink of alcohol    Types: 1 Cans  of beer per week   Drug use: Not Currently    Types: Marijuana   Sexual activity: Not on file  Other Topics Concern   Not on file  Social History Narrative   Not on file   Social Drivers of Health   Financial Resource Strain: Not on file  Food Insecurity: Not on file  Transportation Needs: Not on file  Physical Activity: Not on file  Stress: Not on file  Social Connections: Not on file    No Known Allergies  Outpatient Medications Prior to Visit  Medication Sig Dispense Refill   amLODipine  (NORVASC ) 10 MG tablet Take 1 tablet (10 mg total) by mouth daily. To lower blood pressure 90 tablet 1   losartan -hydrochlorothiazide  (HYZAAR ) 50-12.5 MG tablet Take 1 tablet by mouth daily. To lower blood pressure 90 tablet 1   benzonatate  (TESSALON  PERLES) 100 MG capsule Take 1 capsule (100 mg total) by mouth 3 (three) times daily as needed for cough. (Patient not taking: Reported on 10/26/2023) 20 capsule 0   ondansetron  (ZOFRAN ) 4 MG tablet Take 1 tablet (4 mg total) by mouth every 8 (eight) hours as needed for nausea or vomiting. (Patient not taking: Reported on 10/26/2023) 20 tablet 0   No facility-administered medications prior to visit.     ROS Review of Systems  Constitutional:  Negative for activity change and appetite change.  HENT:  Negative for sinus pressure and sore throat.   Respiratory:  Negative for chest tightness, shortness of breath and wheezing.   Cardiovascular:  Negative for chest pain and palpitations.  Gastrointestinal:  Negative for abdominal distention, abdominal pain and constipation.  Genitourinary: Negative.   Musculoskeletal:        See HPI  Psychiatric/Behavioral:  Negative for behavioral problems and dysphoric mood.     Objective:  BP (!) 145/88   Pulse 84   Ht 5' 5 (1.651 m)   Wt 170 lb 3.2 oz (77.2 kg)   SpO2 99%   BMI 28.32 kg/m      10/26/2023    9:02 AM 10/26/2023    8:40 AM 04/19/2023    9:18 AM  BP/Weight  Systolic BP 145 153 159   Diastolic BP 88 90 95  Wt. (Lbs)  170.2   BMI  28.32 kg/m2       Physical Exam Constitutional:      Appearance: She is well-developed.  Cardiovascular:     Rate and Rhythm: Normal rate.     Heart sounds: Normal heart sounds. No murmur heard. Pulmonary:     Effort: Pulmonary effort is normal.     Breath sounds: Normal breath sounds. No wheezing or rales.  Chest:     Chest wall: No tenderness.  Abdominal:     General: Bowel sounds are normal. There is no distension.     Palpations: Abdomen is soft. There is no mass.     Tenderness: There is no abdominal tenderness.  Musculoskeletal:     Right lower leg: No edema.     Left lower leg: No edema.     Comments: Slight tenderness on palpation of area beneath left axilla.  No tenderness on twisting motion of the spine  Neurological:     Mental Status: She is alert and oriented to person, place, and time.  Psychiatric:        Mood and Affect: Mood normal.        Latest Ref Rng & Units 05/17/2023    8:36 AM 04/19/2023    9:50 AM 06/28/2022    4:18 PM  CMP  Glucose 70 - 99 mg/dL 877  889  96   BUN 6 - 24 mg/dL 14  16  13    Creatinine 0.57 - 1.00 mg/dL 9.10  9.11  9.23   Sodium 134 - 144 mmol/L 142  141  144   Potassium 3.5 - 5.2 mmol/L 4.9  5.4  4.4   Chloride 96 - 106 mmol/L 104  103  107   CO2 20 - 29 mmol/L 22  21  22    Calcium 8.7 - 10.2 mg/dL 9.8  89.8  9.3   Total Protein 6.0 - 8.5 g/dL  7.6    Total Bilirubin 0.0 - 1.2 mg/dL  0.8    Alkaline Phos 44 - 121 IU/L  90    AST 0 - 40 IU/L  28    ALT 0 - 32 IU/L  35      Lipid Panel     Component Value Date/Time   CHOL 167 10/13/2021 1101   TRIG 72 10/13/2021 1101   HDL 62 10/13/2021 1101   CHOLHDL 2.0 01/10/2019 0928   LDLCALC 91 10/13/2021 1101    CBC    Component Value Date/Time   WBC 6.8 04/19/2023 0950   WBC 6.2 01/11/2019 2149   RBC 4.46 04/19/2023 0950   RBC 4.43 01/11/2019 2149  HGB 13.8 04/19/2023 0950   HCT 42.3 04/19/2023 0950   PLT 395  04/19/2023 0950   MCV 95 04/19/2023 0950   MCH 30.9 04/19/2023 0950   MCH 31.4 01/11/2019 2149   MCHC 32.6 04/19/2023 0950   MCHC 34.1 01/11/2019 2149   RDW 12.6 04/19/2023 0950   LYMPHSABS 2.5 04/19/2023 0950   MONOABS 0.6 01/11/2019 2149   EOSABS 0.4 04/19/2023 0950   BASOSABS 0.1 04/19/2023 0950    Lab Results  Component Value Date   HGBA1C 5.8 10/26/2023       Assessment & Plan Essential hypertension Blood pressure remains elevated despite amlodipine  and losartan -hydrochlorothiazide , necessitating medication adjustment. - Increase losartan -hydrochlorothiazide  to 100-25 mg daily. - Advise low sodium diet and regular exercise. - Follow up in 3 months for blood pressure evaluation.  Prediabetes Prediabetes identified in January requires reassessment. - Order A1c test to assess current status of prediabetes. -Labs reveal prediabetes with an A1c of 5.8.  An A1c of 6.5 is supportive of a diagnosis of type 2 diabetes mellitus.  Working on a low carbohydrate diet, exercise, weight loss is recommended in order to prevent progression to type 2 diabetes mellitus.   Left axillary and lateral chest wall muscle spasm Intermittent pain under the left arm, likely due to muscle spasm, occurs during certain activities and resolves with rest. - Prescribe muscle relaxer for pain management. - Advise use of warm compress and massage. - Recommend exercises such as twisting and reaching to stretch the muscle.     Healthcare maintenance Screening for colon cancer and breast cancer -: FIT testing, mammogram ordered  Meds ordered this encounter  Medications   amLODipine  (NORVASC ) 10 MG tablet    Sig: Take 1 tablet (10 mg total) by mouth daily. To lower blood pressure    Dispense:  90 tablet    Refill:  1   tiZANidine  (ZANAFLEX ) 4 MG tablet    Sig: Take 1 tablet (4 mg total) by mouth every 8 (eight) hours as needed.    Dispense:  60 tablet    Refill:  1   losartan -hydrochlorothiazide   (HYZAAR ) 100-25 MG tablet    Sig: Take 1 tablet by mouth daily.    Dispense:  90 tablet    Refill:  1    Discontinue previous dose    Follow-up: Return in about 6 months (around 04/27/2024).       Corrina Sabin, MD, FAAFP. [facility] [place], [place] [phone]   10/26/2023, 10:57 AM

## 2023-10-27 ENCOUNTER — Other Ambulatory Visit: Payer: Self-pay

## 2023-12-09 LAB — FECAL OCCULT BLOOD, IMMUNOCHEMICAL: Fecal Occult Bld: NEGATIVE

## 2023-12-11 ENCOUNTER — Ambulatory Visit: Payer: Self-pay | Admitting: Family Medicine

## 2024-05-01 ENCOUNTER — Ambulatory Visit: Admitting: Family Medicine
